# Patient Record
Sex: Female | Born: 1955 | Race: White | Hispanic: No | Smoking: Former smoker
Health system: Southern US, Community
[De-identification: ages and names within clinical notes are randomized; demographics above are authoritative.]

## PROBLEM LIST (undated history)

## (undated) DIAGNOSIS — K219 Gastro-esophageal reflux disease without esophagitis: Secondary | ICD-10-CM

## (undated) DIAGNOSIS — F32A Depression, unspecified: Secondary | ICD-10-CM

## (undated) DIAGNOSIS — C801 Malignant (primary) neoplasm, unspecified: Secondary | ICD-10-CM

## (undated) DIAGNOSIS — M549 Dorsalgia, unspecified: Secondary | ICD-10-CM

## (undated) DIAGNOSIS — M542 Cervicalgia: Secondary | ICD-10-CM

## (undated) DIAGNOSIS — M1990 Unspecified osteoarthritis, unspecified site: Secondary | ICD-10-CM

## (undated) DIAGNOSIS — Z5189 Encounter for other specified aftercare: Secondary | ICD-10-CM

## (undated) DIAGNOSIS — F419 Anxiety disorder, unspecified: Secondary | ICD-10-CM

## (undated) DIAGNOSIS — I10 Essential (primary) hypertension: Secondary | ICD-10-CM

## (undated) DIAGNOSIS — G8929 Other chronic pain: Secondary | ICD-10-CM

## (undated) DIAGNOSIS — J449 Chronic obstructive pulmonary disease, unspecified: Secondary | ICD-10-CM

## (undated) DIAGNOSIS — E079 Disorder of thyroid, unspecified: Secondary | ICD-10-CM

## (undated) DIAGNOSIS — F329 Major depressive disorder, single episode, unspecified: Secondary | ICD-10-CM

## (undated) DIAGNOSIS — D649 Anemia, unspecified: Secondary | ICD-10-CM

## (undated) HISTORY — PX: COLON SURGERY: SHX602

## (undated) HISTORY — PX: ABDOMINAL HYSTERECTOMY: SHX81

## (undated) HISTORY — PX: TONSILLECTOMY: SUR1361

## (undated) HISTORY — DX: Encounter for other specified aftercare: Z51.89

## (undated) HISTORY — PX: CHOLECYSTECTOMY: SHX55

## (undated) HISTORY — PX: HERNIA REPAIR: SHX51

## (undated) HISTORY — DX: Anemia, unspecified: D64.9

## (undated) HISTORY — DX: Gastro-esophageal reflux disease without esophagitis: K21.9

## (undated) HISTORY — DX: Chronic obstructive pulmonary disease, unspecified: J44.9

## (undated) HISTORY — DX: Unspecified osteoarthritis, unspecified site: M19.90

---

## 1998-11-24 HISTORY — PX: GASTRIC BYPASS: SHX52

## 2008-04-04 ENCOUNTER — Encounter: Payer: Self-pay | Admitting: Gastroenterology

## 2008-08-29 ENCOUNTER — Ambulatory Visit: Payer: Self-pay | Admitting: Gastroenterology

## 2008-08-29 DIAGNOSIS — K219 Gastro-esophageal reflux disease without esophagitis: Secondary | ICD-10-CM

## 2008-08-29 DIAGNOSIS — Z85038 Personal history of other malignant neoplasm of large intestine: Secondary | ICD-10-CM | POA: Insufficient documentation

## 2008-08-30 ENCOUNTER — Telehealth (INDEPENDENT_AMBULATORY_CARE_PROVIDER_SITE_OTHER): Payer: Self-pay | Admitting: *Deleted

## 2008-08-30 LAB — CONVERTED CEMR LAB: CEA: 2.6 ng/mL (ref 0.0–5.0)

## 2008-09-26 ENCOUNTER — Ambulatory Visit: Payer: Self-pay | Admitting: Gastroenterology

## 2008-09-26 ENCOUNTER — Encounter: Payer: Self-pay | Admitting: Gastroenterology

## 2008-09-27 ENCOUNTER — Encounter: Payer: Self-pay | Admitting: Gastroenterology

## 2009-01-03 ENCOUNTER — Ambulatory Visit (HOSPITAL_COMMUNITY): Admission: RE | Admit: 2009-01-03 | Discharge: 2009-01-03 | Payer: Self-pay | Admitting: General Surgery

## 2009-07-09 ENCOUNTER — Encounter: Admission: RE | Admit: 2009-07-09 | Discharge: 2009-07-09 | Payer: Self-pay | Admitting: Orthopedic Surgery

## 2009-12-05 ENCOUNTER — Ambulatory Visit: Payer: Self-pay | Admitting: Diagnostic Radiology

## 2009-12-05 ENCOUNTER — Emergency Department (HOSPITAL_BASED_OUTPATIENT_CLINIC_OR_DEPARTMENT_OTHER): Admission: EM | Admit: 2009-12-05 | Discharge: 2009-12-05 | Payer: Self-pay | Admitting: Emergency Medicine

## 2010-11-06 ENCOUNTER — Other Ambulatory Visit
Admission: RE | Admit: 2010-11-06 | Discharge: 2010-11-06 | Payer: Self-pay | Source: Home / Self Care | Admitting: Family Medicine

## 2010-12-15 ENCOUNTER — Encounter: Payer: Self-pay | Admitting: Family Medicine

## 2010-12-15 ENCOUNTER — Encounter: Payer: Self-pay | Admitting: General Surgery

## 2011-03-11 LAB — COMPREHENSIVE METABOLIC PANEL
Albumin: 4.1 g/dL (ref 3.5–5.2)
Alkaline Phosphatase: 102 U/L (ref 39–117)
BUN: 11 mg/dL (ref 6–23)
CO2: 23 mEq/L (ref 19–32)
Chloride: 100 mEq/L (ref 96–112)
GFR calc Af Amer: >60 mL/min (ref >60)
Glucose, Bld: 110 mg/dL — ABNORMAL HIGH (ref 70–99)
Potassium: 3.9 mEq/L (ref 3.5–5.1)
Total Protein: 7.5 g/dL (ref 6.0–8.3)

## 2012-10-26 ENCOUNTER — Ambulatory Visit (HOSPITAL_COMMUNITY)
Admission: RE | Admit: 2012-10-26 | Discharge: 2012-10-26 | Disposition: A | Discharge status: Home / Self Care | Payer: Self-pay | Source: Ambulatory Visit | Attending: Internal Medicine | Admitting: Internal Medicine

## 2012-10-26 ENCOUNTER — Other Ambulatory Visit (HOSPITAL_COMMUNITY): Payer: Self-pay | Admitting: Internal Medicine

## 2012-10-26 DIAGNOSIS — R062 Wheezing: Secondary | ICD-10-CM

## 2012-10-26 DIAGNOSIS — R0602 Shortness of breath: Secondary | ICD-10-CM

## 2013-01-17 ENCOUNTER — Emergency Department (HOSPITAL_COMMUNITY): Payer: BC Managed Care – PPO

## 2013-01-17 ENCOUNTER — Encounter (HOSPITAL_COMMUNITY): Payer: Self-pay

## 2013-01-17 ENCOUNTER — Inpatient Hospital Stay (HOSPITAL_COMMUNITY)
Admission: EM | Admit: 2013-01-17 | Discharge: 2013-01-18 | DRG: 088 | Disposition: A | Discharge status: Home / Self Care | Payer: BC Managed Care – PPO | Attending: Internal Medicine | Admitting: Internal Medicine

## 2013-01-17 DIAGNOSIS — F3289 Other specified depressive episodes: Secondary | ICD-10-CM | POA: Diagnosis present

## 2013-01-17 DIAGNOSIS — R0902 Hypoxemia: Secondary | ICD-10-CM

## 2013-01-17 DIAGNOSIS — Z9049 Acquired absence of other specified parts of digestive tract: Secondary | ICD-10-CM

## 2013-01-17 DIAGNOSIS — J9801 Acute bronchospasm: Secondary | ICD-10-CM

## 2013-01-17 DIAGNOSIS — I10 Essential (primary) hypertension: Secondary | ICD-10-CM | POA: Diagnosis present

## 2013-01-17 DIAGNOSIS — R079 Chest pain, unspecified: Secondary | ICD-10-CM

## 2013-01-17 DIAGNOSIS — D649 Anemia, unspecified: Secondary | ICD-10-CM | POA: Diagnosis present

## 2013-01-17 DIAGNOSIS — Z79899 Other long term (current) drug therapy: Secondary | ICD-10-CM

## 2013-01-17 DIAGNOSIS — Z9089 Acquired absence of other organs: Secondary | ICD-10-CM

## 2013-01-17 DIAGNOSIS — I77810 Thoracic aortic ectasia: Secondary | ICD-10-CM | POA: Diagnosis present

## 2013-01-17 DIAGNOSIS — J441 Chronic obstructive pulmonary disease with (acute) exacerbation: Secondary | ICD-10-CM

## 2013-01-17 DIAGNOSIS — M542 Cervicalgia: Secondary | ICD-10-CM | POA: Diagnosis present

## 2013-01-17 DIAGNOSIS — Z90710 Acquired absence of both cervix and uterus: Secondary | ICD-10-CM

## 2013-01-17 DIAGNOSIS — F17200 Nicotine dependence, unspecified, uncomplicated: Secondary | ICD-10-CM | POA: Diagnosis present

## 2013-01-17 DIAGNOSIS — Z791 Long term (current) use of non-steroidal anti-inflammatories (NSAID): Secondary | ICD-10-CM

## 2013-01-17 DIAGNOSIS — Z85038 Personal history of other malignant neoplasm of large intestine: Secondary | ICD-10-CM

## 2013-01-17 DIAGNOSIS — E119 Type 2 diabetes mellitus without complications: Secondary | ICD-10-CM | POA: Diagnosis present

## 2013-01-17 DIAGNOSIS — E039 Hypothyroidism, unspecified: Secondary | ICD-10-CM | POA: Diagnosis present

## 2013-01-17 DIAGNOSIS — R911 Solitary pulmonary nodule: Secondary | ICD-10-CM | POA: Diagnosis present

## 2013-01-17 DIAGNOSIS — I2510 Atherosclerotic heart disease of native coronary artery without angina pectoris: Secondary | ICD-10-CM | POA: Diagnosis present

## 2013-01-17 DIAGNOSIS — K219 Gastro-esophageal reflux disease without esophagitis: Secondary | ICD-10-CM | POA: Diagnosis present

## 2013-01-17 DIAGNOSIS — M940 Chondrocostal junction syndrome [Tietze]: Secondary | ICD-10-CM | POA: Diagnosis present

## 2013-01-17 DIAGNOSIS — G8929 Other chronic pain: Secondary | ICD-10-CM | POA: Diagnosis present

## 2013-01-17 DIAGNOSIS — F329 Major depressive disorder, single episode, unspecified: Secondary | ICD-10-CM | POA: Diagnosis present

## 2013-01-17 HISTORY — DX: Malignant (primary) neoplasm, unspecified: C80.1

## 2013-01-17 HISTORY — DX: Dorsalgia, unspecified: M54.9

## 2013-01-17 HISTORY — DX: Essential (primary) hypertension: I10

## 2013-01-17 HISTORY — DX: Depression, unspecified: F32.A

## 2013-01-17 HISTORY — DX: Other chronic pain: G89.29

## 2013-01-17 HISTORY — DX: Major depressive disorder, single episode, unspecified: F32.9

## 2013-01-17 HISTORY — DX: Disorder of thyroid, unspecified: E07.9

## 2013-01-17 HISTORY — DX: Cervicalgia: M54.2

## 2013-01-17 LAB — BLOOD GAS, ARTERIAL
Acid-Base Excess: 0.4 mmol/L (ref 0.0–2.0)
Bicarbonate: 25.1 mEq/L — ABNORMAL HIGH (ref 20.0–24.0)
Drawn by: 235321
FIO2: 0.5 %
O2 Saturation: 94.8 %
Patient temperature: 98.6
TCO2: 23.3 mmol/L (ref 0–100)
pCO2 arterial: 43.2 mmHg (ref 35.0–45.0)
pH, Arterial: 7.382 (ref 7.350–7.450)
pO2, Arterial: 79.1 mmHg — ABNORMAL LOW (ref 80.0–100.0)

## 2013-01-17 LAB — CBC
HCT: 37.9 % (ref 36.0–46.0)
Hemoglobin: 10.9 g/dL — ABNORMAL LOW (ref 12.0–15.0)
MCH: 20.6 pg — ABNORMAL LOW (ref 26.0–34.0)
MCHC: 28.8 g/dL — ABNORMAL LOW (ref 30.0–36.0)
MCV: 71.6 fL — ABNORMAL LOW (ref 78.0–100.0)
Platelets: 305 10*3/uL (ref 150–400)
RBC: 5.29 MIL/uL — ABNORMAL HIGH (ref 3.87–5.11)
RDW: 18.1 % — ABNORMAL HIGH (ref 11.5–15.5)
WBC: 9.9 10*3/uL (ref 4.0–10.5)

## 2013-01-17 LAB — BASIC METABOLIC PANEL
BUN: 12 mg/dL (ref 6–23)
CO2: 27 mEq/L (ref 19–32)
Calcium: 9 mg/dL (ref 8.4–10.5)
Chloride: 96 mEq/L (ref 96–112)
Creatinine, Ser: 0.65 mg/dL (ref 0.50–1.10)
GFR calc Af Amer: >90 mL/min (ref >90)
GFR calc non Af Amer: >90 mL/min (ref >90)
Glucose, Bld: 182 mg/dL — ABNORMAL HIGH (ref 70–99)
Potassium: 3.8 mEq/L (ref 3.5–5.1)
Sodium: 135 mEq/L (ref 135–145)

## 2013-01-17 LAB — TROPONIN I: Troponin I: <0.3 ng/mL (ref <0.30)

## 2013-01-17 MED ORDER — ACETAMINOPHEN 650 MG RE SUPP: 650.0000 mg | Freq: Four times a day (QID) | RECTAL | Status: DC | PRN

## 2013-01-17 MED ORDER — TRIAMTERENE-HCTZ 37.5-25 MG PO TABS
1.0000 | ORAL_TABLET | Freq: Every day | ORAL | Status: DC
  Administered 2013-01-18: 1 via ORAL
  Filled 2013-01-17: qty 1

## 2013-01-17 MED ORDER — ACETAMINOPHEN 325 MG PO TABS: 650.0000 mg | ORAL_TABLET | Freq: Four times a day (QID) | ORAL | Status: DC | PRN

## 2013-01-17 MED ORDER — IPRATROPIUM BROMIDE 0.02 % IN SOLN
0.5000 mg | RESPIRATORY_TRACT | Status: DC
  Administered 2013-01-17: 0.5 mg via RESPIRATORY_TRACT
  Filled 2013-01-17: qty 2.5

## 2013-01-17 MED ORDER — OXYCODONE-ACETAMINOPHEN 10-325 MG PO TABS: 1.0000 | ORAL_TABLET | Freq: Three times a day (TID) | ORAL | Status: DC | PRN

## 2013-01-17 MED ORDER — LEVOTHYROXINE SODIUM 125 MCG PO TABS
125.0000 ug | ORAL_TABLET | Freq: Every day | ORAL | Status: DC
  Administered 2013-01-18: 125 ug via ORAL
  Filled 2013-01-17 (×2): qty 1

## 2013-01-17 MED ORDER — ALBUTEROL SULFATE (5 MG/ML) 0.5% IN NEBU
5.0000 mg | INHALATION_SOLUTION | Freq: Once | RESPIRATORY_TRACT | Status: AC
  Administered 2013-01-17: 5 mg via RESPIRATORY_TRACT

## 2013-01-17 MED ORDER — IOHEXOL 350 MG/ML SOLN
100.0000 mL | Freq: Once | INTRAVENOUS | Status: AC | PRN
  Administered 2013-01-17: 100 mL via INTRAVENOUS

## 2013-01-17 MED ORDER — PREDNISONE 20 MG PO TABS
60.0000 mg | ORAL_TABLET | Freq: Once | ORAL | Status: AC
  Administered 2013-01-17: 60 mg via ORAL
  Filled 2013-01-17: qty 3

## 2013-01-17 MED ORDER — LEVOFLOXACIN IN D5W 750 MG/150ML IV SOLN
750.0000 mg | INTRAVENOUS | Status: DC
  Administered 2013-01-17: 750 mg via INTRAVENOUS
  Filled 2013-01-17 (×2): qty 150

## 2013-01-17 MED ORDER — HEPARIN SODIUM (PORCINE) 5000 UNIT/ML IJ SOLN
5000.0000 [IU] | Freq: Three times a day (TID) | INTRAMUSCULAR | Status: DC
  Administered 2013-01-18: 5000 [IU] via SUBCUTANEOUS
  Filled 2013-01-17 (×4): qty 1

## 2013-01-17 MED ORDER — ALBUTEROL SULFATE (5 MG/ML) 0.5% IN NEBU: 2.5000 mg | INHALATION_SOLUTION | RESPIRATORY_TRACT | Status: DC | PRN

## 2013-01-17 MED ORDER — OXYCODONE-ACETAMINOPHEN 5-325 MG PO TABS
1.0000 | ORAL_TABLET | Freq: Once | ORAL | Status: AC
  Administered 2013-01-17: 1 via ORAL
  Filled 2013-01-17: qty 1

## 2013-01-17 MED ORDER — ALBUTEROL SULFATE (5 MG/ML) 0.5% IN NEBU
2.5000 mg | INHALATION_SOLUTION | RESPIRATORY_TRACT | Status: DC
  Administered 2013-01-17: 2.5 mg via RESPIRATORY_TRACT
  Filled 2013-01-17: qty 0.5

## 2013-01-17 MED ORDER — ARIPIPRAZOLE 10 MG PO TABS
10.0000 mg | ORAL_TABLET | Freq: Every day | ORAL | Status: DC
  Administered 2013-01-18: 10 mg via ORAL
  Filled 2013-01-17: qty 1

## 2013-01-17 MED ORDER — ASPIRIN EC 325 MG PO TBEC
325.0000 mg | DELAYED_RELEASE_TABLET | Freq: Every day | ORAL | Status: DC
  Administered 2013-01-18: 325 mg via ORAL
  Filled 2013-01-17: qty 1

## 2013-01-17 MED ORDER — ALBUTEROL (5 MG/ML) CONTINUOUS INHALATION SOLN
15.0000 mg | INHALATION_SOLUTION | Freq: Once | RESPIRATORY_TRACT | Status: AC
  Administered 2013-01-17: 15 mg via RESPIRATORY_TRACT

## 2013-01-17 MED ORDER — METHYLPREDNISOLONE SODIUM SUCC 125 MG IJ SOLR
125.0000 mg | Freq: Three times a day (TID) | INTRAMUSCULAR | Status: DC
  Administered 2013-01-17 – 2013-01-18 (×2): 125 mg via INTRAVENOUS
  Filled 2013-01-17 (×5): qty 2

## 2013-01-17 MED ORDER — PANTOPRAZOLE SODIUM 40 MG PO TBEC
40.0000 mg | DELAYED_RELEASE_TABLET | Freq: Every day | ORAL | Status: DC
  Administered 2013-01-18: 40 mg via ORAL
  Filled 2013-01-17: qty 1

## 2013-01-17 MED ORDER — LORAZEPAM 2 MG/ML IJ SOLN
0.5000 mg | Freq: Once | INTRAMUSCULAR | Status: AC
  Administered 2013-01-17: via INTRAVENOUS
  Filled 2013-01-17: qty 1

## 2013-01-17 MED ORDER — ASPIRIN EC 81 MG PO TBEC: 81.0000 mg | DELAYED_RELEASE_TABLET | Freq: Every day | ORAL | Status: DC

## 2013-01-17 MED ORDER — AMITRIPTYLINE HCL 25 MG PO TABS
25.0000 mg | ORAL_TABLET | Freq: Every day | ORAL | Status: DC
  Administered 2013-01-17: 25 mg via ORAL
  Filled 2013-01-17 (×2): qty 1

## 2013-01-17 MED ORDER — OXYCODONE-ACETAMINOPHEN 5-325 MG PO TABS
1.0000 | ORAL_TABLET | Freq: Three times a day (TID) | ORAL | Status: DC | PRN
  Administered 2013-01-18 (×2): 1 via ORAL
  Filled 2013-01-17 (×2): qty 1

## 2013-01-17 MED ORDER — IPRATROPIUM BROMIDE 0.02 % IN SOLN
0.5000 mg | Freq: Once | RESPIRATORY_TRACT | Status: AC
Start: 2013-01-17 — End: 2013-01-17
  Administered 2013-01-17: 0.5 mg via RESPIRATORY_TRACT
  Filled 2013-01-17: qty 2.5

## 2013-01-17 MED ORDER — OXYCODONE HCL 5 MG PO TABS: 5.0000 mg | ORAL_TABLET | Freq: Three times a day (TID) | ORAL | Status: DC | PRN

## 2013-01-17 MED ORDER — IPRATROPIUM BROMIDE 0.02 % IN SOLN: 0.5000 mg | RESPIRATORY_TRACT | Status: DC | PRN

## 2013-01-17 MED ORDER — SERTRALINE HCL 100 MG PO TABS
100.0000 mg | ORAL_TABLET | Freq: Every day | ORAL | Status: DC
  Administered 2013-01-18: 100 mg via ORAL
  Filled 2013-01-17: qty 1

## 2013-01-17 MED ORDER — SODIUM CHLORIDE 0.9 % IJ SOLN
3.0000 mL | Freq: Two times a day (BID) | INTRAMUSCULAR | Status: DC
  Administered 2013-01-18 (×2): 3 mL via INTRAVENOUS

## 2013-01-17 NOTE — ED Provider Notes (Signed)
History    57 year old female with dyspnea. Gradual onset several days ago but acutely worse in the last 24 hours. Today patient also began having some central chest pressure which started this morning. This has remained relatively constant since. Does not radiate. No appreciable exacerbating relieving factors. Patient states that her shortness of breath is worse with exertion though. Slightly improved at rest, but not completely relieved. Occasional nonproductive cough. No fevers or chills. No unusual leg pain or swelling. No diagnosed CAD. Patient is a smoker. No history of DVT/PE. No recent surgical procedures or prolonged immobilization.   CSN: [id number]  Arrival date & time 01/17/13  1607   First MD Initiated Contact with Patient 01/17/13 1630      Chief Complaint  Patient presents with  . Chest Pain    (Consider location/radiation/quality/duration/timing/severity/associated sxs/prior treatment) HPI  Past Medical History  Diagnosis Date  . Hypertension   . Thyroid disease     hyperthyroidism  . Chronic back pain   . Chronic neck pain     History reviewed. No pertinent past surgical history.  History reviewed. No pertinent family history.  History  Substance Use Topics  . Smoking status: Current Every Day Smoker -- 1.00 packs/day    Types: Cigarettes  . Smokeless tobacco: Not on file  . Alcohol Use: No    OB History   Grav Para Term Preterm Abortions TAB SAB Ect Mult Living                  Review of Systems  All systems reviewed and negative, other than as noted in HPI.   Allergies  Review of patient's allergies indicates no known allergies.  Home Medications   Current Outpatient Rx  Name  Route  Sig  Dispense  Refill  . amitriptyline (ELAVIL) 25 MG tablet   Oral   Take 25 mg by mouth at bedtime.         . ARIPiprazole (ABILIFY) 10 MG tablet   Oral   Take 10 mg by mouth daily.         Marland Kitchen ibuprofen (ADVIL,MOTRIN) 200 MG tablet   Oral   Take  800 mg by mouth every 8 (eight) hours as needed for pain.         Marland Kitchen levothyroxine (SYNTHROID, LEVOTHROID) 125 MCG tablet   Oral   Take 125 mcg by mouth daily.         Marland Kitchen omeprazole (PRILOSEC) 20 MG capsule   Oral   Take 20 mg by mouth daily.         Marland Kitchen oxyCODONE-acetaminophen (PERCOCET) 10-325 MG per tablet   Oral   Take 1 tablet by mouth every 8 (eight) hours as needed for pain.         Marland Kitchen sertraline (ZOLOFT) 100 MG tablet   Oral   Take 100 mg by mouth daily.         Marland Kitchen triamterene-hydrochlorothiazide (DYAZIDE) 37.5-25 MG per capsule   Oral   Take 1 capsule by mouth every morning.           BP 155/75  Pulse 98  Resp 27  SpO2 99%  Physical Exam  Nursing note and vitals reviewed. Constitutional: She appears well-developed and well-nourished.  Sitting up in bed. Mildly uncomfortable appearing. Obese.   HENT:  Head: Normocephalic and atraumatic.  Eyes: Conjunctivae are normal. Pupils are equal, round, and reactive to light. Right eye exhibits no discharge. Left eye exhibits no discharge.  Neck: Neck supple.  Cardiovascular: Normal rate, regular rhythm and normal heart sounds.  Exam reveals no gallop and no friction rub.   No murmur heard. Pulmonary/Chest: She has wheezes. She exhibits no tenderness.  Mild tachypnea. Diffuse wheezing bilaterally.  Abdominal: Soft. She exhibits no distension. There is no tenderness.  Musculoskeletal: She exhibits no edema and no tenderness.  No calf tenderness. Negative homan's.   Neurological: She is alert.  Skin: Skin is warm and dry.  Psychiatric: She has a normal mood and affect. Her behavior is normal. Thought content normal.    ED Course  Procedures (including critical care time)  Labs Reviewed  CBC - Abnormal; Notable for the following:    RBC 5.29 (*)    Hemoglobin 10.9 (*)    MCV 71.6 (*)    MCH 20.6 (*)    MCHC 28.8 (*)    RDW 18.1 (*)    All other components within normal limits  BASIC METABOLIC PANEL -  Abnormal; Notable for the following:    Glucose, Bld 182 (*)    All other components within normal limits  BLOOD GAS, ARTERIAL - Abnormal; Notable for the following:    pO2, Arterial 79.1 (*)    Bicarbonate 25.1 (*)    All other components within normal limits  TROPONIN I   Dg Chest 2 View  01/17/2013  *RADIOLOGY REPORT*  Clinical Data: Chest pain.  Wheezing.  Tobacco use.  Hypertension.  CHEST - 2 VIEW  Comparison: 01/17/2013  Findings: Cardiothoracic index 54%, compatible with mild cardiomegaly.  There is tortuosity and potentially ectasia of the ascending thoracic aorta.  The lungs appear clear.  No pleural effusion observed.  IMPRESSION:  1.  Mild cardiomegaly. 2.  Tortuosity and potentially ectasia of the descending thoracic aorta.   Original Report Authenticated By: Gaylyn Rong, M.D.    Ct Angio Chest W/cm &/or Wo Cm  01/17/2013  *RADIOLOGY REPORT*  Clinical Data: 57 year old female with chest pain and shortness of breath.  CT ANGIOGRAPHY CHEST  Technique:  Multidetector CT imaging of the chest using the standard protocol during bolus administration of intravenous contrast. Multiplanar reconstructed images including MIPs were obtained and reviewed to evaluate the vascular anatomy.  Contrast: OMNIPAQUE IOHEXOL 350 MG/ML SOLN  Comparison: 01/17/2013 chest radiograph  Findings: This is a technically adequate study but motion artifact and suboptimal contrast opacification limits evaluation.  No definite pulmonary emboli are identified.  Moderate to heavy coronary artery calcifications are present. Ectasia of the ascending thoracic aorta noted measuring 3.8 cm in diameter.  There are no pleural or pericardial effusions present. No enlarged lymph nodes are present.  Minimal bibasilar atelectasis/scarring is noted.  Mild centrilobular emphysema is noted.  A 7 mm subpleural/peripheral nodule along the posterior right upper lobe (image 20) is present.  There is no evidence of pulmonary mass,  airspace disease, consolidation or endobronchial/endotracheal lesion.  No acute or suspicious bony abnormalities are noted. Postoperative changes along the proximal stomach noted. There is a small hiatal hernia present.  IMPRESSION: No evidence of pulmonary emboli, but study is suboptimal as described above.  Coronary artery disease and ectatic ascending thoracic aorta.  7 mm posterior right upper lobe pulmonary nodule. If the patient is at high risk for bronchogenic carcinoma, follow-up chest CT at 3-6 months is recommended.  If the patient is at low risk for bronchogenic carcinoma, follow-up chest CT at 6-12 months is recommended.  This recommendation follows the consensus statement: Guidelines for Management of Small Pulmonary Nodules Detected on CT Scans:  A Statement from the Fleischner Society as published in Radiology 2005; 237:395-400.  Centrilobular emphysema.  Small hiatal hernia.   Original Report Authenticated By: Harmon Pier, M.D.    EKG:  Rhythm: normal sinus Poor baseline Vent. rate 98 BPM PR interval 140 ms QRS duration 100 ms QT/QTc 348/444 ms Anterior q waves ST segments: NS ST changes Comparison: none   1. Hypoxia   2. Bronchospasm   3. Chest pain       MDM  57 year old female with dyspnea and chest pain. Consider ACS, but doubt Patient with diffuse wheezing on exam. Her chest x-ray is clear. CT angiography was performed to evaluate for possible pulmonary embolism. There is no evidence of pulmonary embolism although suboptimal study because of some motion artifact. Lung parenchyma is unremarkable in appearance aside from a nodule. Patient with a long-standing history of smoking. Suspect she has undiagnosed underlying lung disease. Patient received steroids. She received multiple nebs. She remains symptomatic and hypoxic with an oxygen requirement. Will admit for further treatment and evaluation.        Raeford Razor, MD 01/20/13 [phone]

## 2013-01-17 NOTE — ED Notes (Signed)
Patient transported to CT 

## 2013-01-17 NOTE — H&P (Signed)
Hospitalist Admission History and Physical  Patient name: Jacqueline Moyer Medical record number: [id number] Date of birth: 04-07-1956 Age: 58 y.o. Gender: female  Primary Care Provider: Berenda Morale, MD  Chief Complaint: dyspnea, COPD exacerbation History of Present Illness:This is a 57 y.o. year old female with past medical history of hypertension, reflux, colon cancer status post resection in 2004, 40-pack-year smoking history presenting with shortness of breath x10 days. Patient reports having progressive shortness of breath that has been unrelieved at home. Predominant symptoms have been wheezing and chest tightness. No fevers or chills. No body aches. Patient has had some mild URI symptoms including rhinorrhea and nasal congestion. Has been still smoking despite symptoms. Patient states that she's been without insurance however received insurance over the past week. [facility] to [facility] care within the past one to 2 days. Shortness but has persisted to the point of having some dyspnea even while at rest. Shortness rest is worse with exertion. Patient has had some mild chest pain. Though patient describes pain as more of a chest tightness. No prior history of MI. No significant family history of early MI. Patient denies any recent extended trips. No prior history of blood clots. Patient has not been formally evaluated for COPD in the past.  In the ED, patient was noted to be hypoxic with O2 sats around 89% on room air. Patient was given DuoNeb treatment with patient reporting marked improvement in resp status afterwards however still with persistent mild dyspnea. Patient also given 60 mg of oral prednisone A CT angiogram was obtained that was negative for PE but it showed central lobular emphysema, a 7 mm right upper lobe pulmonary nodule, coronary artery disease and ectatic thoracic aorta.  Patient Active Problem List  Diagnosis  . GERD  . PERSONAL HX COLON CANCER    Past Medical History: Past Medical History  Diagnosis Date  . Hypertension   . Thyroid disease     hyperthyroidism  . Chronic back pain   . Chronic neck pain   . Depression   . Cancer approx 2004    colon cancer    Past Surgical History: Past Surgical History  Procedure Laterality Date  . Cholecystectomy    . Tonsillectomy    . Abdominal hysterectomy    . Colon surgery  approx. 2004    for colon cancer    Social History: History   Social History  . Marital Status: Divorced    Spouse Name: N/A    Number of Children: N/A  . Years of Education: N/A   Social History Main Topics  . Smoking status: Current Every Day Smoker -- 1.00 packs/day for 40 years    Types: Cigarettes  . Smokeless tobacco: Never Used  . Alcohol Use: No  . Drug Use: No  . Sexually Active: None   Other Topics Concern  . None   Social History Narrative  . None    Family History: History reviewed. No pertinent family history.  Allergies: No Known Allergies  Current Facility-Administered Medications  Medication Dose Route Frequency Provider Last Rate Last Dose  . acetaminophen (TYLENOL) tablet 650 mg  650 mg Oral Q6H PRN Doree Albee, MD       Or  . acetaminophen (TYLENOL) suppository 650 mg  650 mg Rectal Q6H PRN Doree Albee, MD      . albuterol (PROVENTIL) (5 MG/ML) 0.5% nebulizer solution 2.5 mg  2.5 mg Nebulization Q4H Doree Albee, MD      . albuterol (  PROVENTIL) (5 MG/ML) 0.5% nebulizer solution 2.5 mg  2.5 mg Nebulization Q2H PRN Doree Albee, MD      . aspirin EC tablet 81 mg  81 mg Oral Daily Doree Albee, MD      . heparin injection 5,000 Units  5,000 Units Subcutaneous Q8H Doree Albee, MD      . ipratropium (ATROVENT) nebulizer solution 0.5 mg  0.5 mg Nebulization Q4H Doree Albee, MD      . ipratropium (ATROVENT) nebulizer solution 0.5 mg  0.5 mg Nebulization Q2H PRN Doree Albee, MD      . levofloxacin (LEVAQUIN) IVPB 750 mg  750 mg Intravenous Q24H Doree Albee,  MD      . methylPREDNISolone sodium succinate (SOLU-MEDROL) 125 mg/2 mL injection 125 mg  125 mg Intravenous Q8H Doree Albee, MD      . sodium chloride 0.9 % injection 3 mL  3 mL Intravenous Q12H Doree Albee, MD       Current Outpatient Prescriptions  Medication Sig Dispense Refill  . amitriptyline (ELAVIL) 25 MG tablet Take 25 mg by mouth at bedtime.      . ARIPiprazole (ABILIFY) 10 MG tablet Take 10 mg by mouth daily.      Marland Kitchen ibuprofen (ADVIL,MOTRIN) 200 MG tablet Take 800 mg by mouth every 8 (eight) hours as needed for pain.      Marland Kitchen levothyroxine (SYNTHROID, LEVOTHROID) 125 MCG tablet Take 125 mcg by mouth daily.      Marland Kitchen omeprazole (PRILOSEC) 20 MG capsule Take 20 mg by mouth daily.      Marland Kitchen oxyCODONE-acetaminophen (PERCOCET) 10-325 MG per tablet Take 1 tablet by mouth every 8 (eight) hours as needed for pain.      Marland Kitchen sertraline (ZOLOFT) 100 MG tablet Take 100 mg by mouth daily.      Marland Kitchen triamterene-hydrochlorothiazide (DYAZIDE) 37.5-25 MG per capsule Take 1 capsule by mouth every morning.       Review Of Systems: 12 point ROS negative except as noted above in HPI.  Physical Exam: Filed Vitals:   01/17/13 1945  BP:   Pulse: 113  Resp: 27    General: obese, mild resp distree HEENT: PERRLA, extra ocular movement intact and sclera clear, anicteric Heart: tachycardic in setting of albuterol use, no murmurs  Lungs: diffuse expiratory wheezes, + mild anterior chest wall TTP  Abdomen: obese, non tender  Extremities: 1-2 + pitting edema  Skin:no rashes Neurology: normal without focal findings, mental status, speech normal, alert and oriented x3 and generalized dizziness in setting of aluterol use   Labs and Imaging: Lab Results  Component Value Date/Time   NA 135 01/17/2013  4:45 PM   K 3.8 01/17/2013  4:45 PM   CL 96 01/17/2013  4:45 PM   CO2 27 01/17/2013  4:45 PM   BUN 12 01/17/2013  4:45 PM   CREATININE 0.65 01/17/2013  4:45 PM   GLUCOSE 182* 01/17/2013  4:45 PM   Lab Results   Component Value Date   WBC 9.9 01/17/2013   HGB 10.9* 01/17/2013   HCT 37.9 01/17/2013   MCV 71.6* 01/17/2013   PLT 305 01/17/2013   Dg Chest 2 View  01/17/2013  *RADIOLOGY REPORT*  Clinical Data: Chest pain.  Wheezing.  Tobacco use.  Hypertension.  CHEST - 2 VIEW  Comparison: 01/17/2013  Findings: Cardiothoracic index 54%, compatible with mild cardiomegaly.  There is tortuosity and potentially ectasia of the ascending thoracic aorta.  The lungs appear clear.  No pleural effusion observed.  IMPRESSION:  1.  Mild cardiomegaly. 2.  Tortuosity and potentially ectasia of the descending thoracic aorta.   Original Report Authenticated By: Gaylyn Rong, M.D.    Ct Angio Chest W/cm &/or Wo Cm  01/17/2013  *RADIOLOGY REPORT*  Clinical Data: 57 year old female with chest pain and shortness of breath.  CT ANGIOGRAPHY CHEST  Technique:  Multidetector CT imaging of the chest using the standard protocol during bolus administration of intravenous contrast. Multiplanar reconstructed images including MIPs were obtained and reviewed to evaluate the vascular anatomy.  Contrast: OMNIPAQUE IOHEXOL 350 MG/ML SOLN  Comparison: 01/17/2013 chest radiograph  Findings: This is a technically adequate study but motion artifact and suboptimal contrast opacification limits evaluation.  No definite pulmonary emboli are identified.  Moderate to heavy coronary artery calcifications are present. Ectasia of the ascending thoracic aorta noted measuring 3.8 cm in diameter.  There are no pleural or pericardial effusions present. No enlarged lymph nodes are present.  Minimal bibasilar atelectasis/scarring is noted.  Mild centrilobular emphysema is noted.  A 7 mm subpleural/peripheral nodule along the posterior right upper lobe (image 20) is present.  There is no evidence of pulmonary mass, airspace disease, consolidation or endobronchial/endotracheal lesion.  No acute or suspicious bony abnormalities are noted. Postoperative changes  along the proximal stomach noted. There is a small hiatal hernia present.  IMPRESSION: No evidence of pulmonary emboli, but study is suboptimal as described above.  Coronary artery disease and ectatic ascending thoracic aorta.  7 mm posterior right upper lobe pulmonary nodule. If the patient is at high risk for bronchogenic carcinoma, follow-up chest CT at 3-6 months is recommended.  If the patient is at low risk for bronchogenic carcinoma, follow-up chest CT at 6-12 months is recommended.  This recommendation follows the consensus statement: Guidelines for Management of Small Pulmonary Nodules Detected on CT Scans: A Statement from the Fleischner Society as published in Radiology 2005; 237:395-400.  Centrilobular emphysema.  Small hiatal hernia.   Original Report Authenticated By: Harmon Pier, M.D.    EKG: sinus rhythm, nonspecific t wave changes.     Assessment and Plan: Jacqueline Moyer is a 57 y.o. year old female presenting with dyspnea  Dyspnea/Resp: Clinically with COPD exacerbation. Likely viral/URI-induced. CTA negative for PE/pneumonia. IV Levaquin. IV Solu-Medrol x24 hours. Albuterol Atrovent nebs. Tobacco cessation consult. Noted right upper lobe pulmonary nodule. Positive malignancy risk factors including active smoking and history of colon cancer status post colon resection. Will need outpatient followup for this.  Chest pain/CV: Likely secondary to chest tightness related to #1 above. Improved after neb treatment Also with mild costochondritis. Patient with some cardiovascular risk factors including hypertension, hyperlipidemia, obesity, smoking. We'll cycle cardiac enzymes. Full dose aspirin. Risk stratification labs. Continue home Maxzide. Tylenol for costochondritis.  Anemia:  Hemoglobin of 10.7 with MCV in the low 70s on presentation. Suspect iron deficiency anemia. We'll check anemia panel.  Chronic pain:  Continue home amitriptyline and Percocet.  Psych: Mood stable.   Continue home Zoloft and Abilify.  Hypothyroid: Continue Synthroid. Check TSH as part of risk stratification workup.   FEN/GI: Heart healthy diet.  Prophylaxis: subq heparin. PPI Disposition: Pending further evaluation.  Code Status: full code.        Doree Albee MD  Pager: [phone]

## 2013-01-17 NOTE — Progress Notes (Signed)
Pt confirms she is seen by providers at [facility] [employer] --[facility], [facility], [facility], [facility], [facility], [facility], [facility], [facility] EPIC updated

## 2013-01-17 NOTE — ED Notes (Signed)
Pt c/o SOB and chest pain.  Sts chest pain/pressure when trying to breathe.  Sts chest pain has resolved with 2L [place].  Pt was sent here by her PCP.  Sts she was told that her EKG was abnormal.  Vitals are stable.

## 2013-01-17 NOTE — ED Notes (Signed)
Patient taken to restroom via wheelchair with portable oxygen. Upon arrival back to room, Patient verbalized to me she was very short of breath. 02 sats at 92%.

## 2013-01-17 NOTE — ED Notes (Signed)
MD at bedside. 

## 2013-01-17 NOTE — ED Notes (Signed)
Pt's O2 sat dropped to 86% while sitting on 5L [place].  MD notified.  Pt put on a Venturi mask at 50%.

## 2013-01-18 DIAGNOSIS — J441 Chronic obstructive pulmonary disease with (acute) exacerbation: Principal | ICD-10-CM

## 2013-01-18 DIAGNOSIS — R079 Chest pain, unspecified: Secondary | ICD-10-CM

## 2013-01-18 DIAGNOSIS — K219 Gastro-esophageal reflux disease without esophagitis: Secondary | ICD-10-CM

## 2013-01-18 DIAGNOSIS — J9801 Acute bronchospasm: Secondary | ICD-10-CM

## 2013-01-18 LAB — RETICULOCYTES: RBC.: 4.88 MIL/uL (ref 3.87–5.11)

## 2013-01-18 LAB — IRON AND TIBC
Iron: 19 ug/dL — ABNORMAL LOW (ref 42–135)
Saturation Ratios: 4 % — ABNORMAL LOW (ref 20–55)
TIBC: 486 ug/dL — ABNORMAL HIGH (ref 250–470)

## 2013-01-18 LAB — VITAMIN B12: Vitamin B-12: 375 pg/mL (ref 211–911)

## 2013-01-18 LAB — HEMOGLOBIN A1C
Hgb A1c MFr Bld: 7.6 % — ABNORMAL HIGH (ref <5.7)
Mean Plasma Glucose: 171 mg/dL — ABNORMAL HIGH (ref <117)

## 2013-01-18 LAB — TSH: TSH: 6.489 u[IU]/mL — ABNORMAL HIGH (ref 0.350–4.500)

## 2013-01-18 LAB — FOLATE: Folate: 12.4 ng/mL

## 2013-01-18 LAB — TROPONIN I
Troponin I: <0.3 ng/mL (ref <0.30)
Troponin I: <0.3 ng/mL (ref <0.30)

## 2013-01-18 MED ORDER — OMEPRAZOLE 20 MG PO CPDR: 40.0000 mg | DELAYED_RELEASE_CAPSULE | Freq: Every day | ORAL | Status: DC

## 2013-01-18 MED ORDER — LEVALBUTEROL HCL 0.63 MG/3ML IN NEBU
0.6300 mg | INHALATION_SOLUTION | RESPIRATORY_TRACT | Status: DC | PRN
  Filled 2013-01-18: qty 3

## 2013-01-18 MED ORDER — LEVOFLOXACIN 750 MG PO TABS: 750.0000 mg | ORAL_TABLET | Freq: Every day | ORAL | Status: DC

## 2013-01-18 MED ORDER — LEVALBUTEROL HCL 0.63 MG/3ML IN NEBU
0.6300 mg | INHALATION_SOLUTION | Freq: Four times a day (QID) | RESPIRATORY_TRACT | Status: DC
  Administered 2013-01-18: 0.63 mg via RESPIRATORY_TRACT
  Filled 2013-01-18 (×5): qty 3

## 2013-01-18 MED ORDER — TIOTROPIUM BROMIDE MONOHYDRATE 18 MCG IN CAPS: 18.0000 ug | ORAL_CAPSULE | Freq: Every day | RESPIRATORY_TRACT | Status: DC

## 2013-01-18 MED ORDER — FLUTICASONE-SALMETEROL 250-50 MCG/DOSE IN AEPB: 1.0000 | INHALATION_SPRAY | Freq: Two times a day (BID) | RESPIRATORY_TRACT | Status: DC

## 2013-01-18 MED ORDER — ALBUTEROL SULFATE HFA 108 (90 BASE) MCG/ACT IN AERS: 2.0000 | INHALATION_SPRAY | RESPIRATORY_TRACT | Status: DC | PRN

## 2013-01-18 MED ORDER — PREDNISONE 20 MG PO TABS: 20.0000 mg | ORAL_TABLET | Freq: Every day | ORAL | Status: DC

## 2013-01-18 MED ORDER — IPRATROPIUM BROMIDE 0.02 % IN SOLN
0.5000 mg | Freq: Four times a day (QID) | RESPIRATORY_TRACT | Status: DC
  Administered 2013-01-18: 0.5 mg via RESPIRATORY_TRACT
  Filled 2013-01-18: qty 2.5

## 2013-01-18 NOTE — Progress Notes (Signed)
Patient noted to have extreme jitters and anxiety post nebulizer therapy. RT treatment protocol with assessment completed. Order changed accordingly.

## 2013-01-18 NOTE — Progress Notes (Signed)
Inpatient Diabetes Program Recommendations  AACE/ADA: New Consensus Statement on Inpatient Glycemic Control (2013)  Target Ranges:  Prepandial:   less than 140 mg/dL      Peak postprandial:   less than 180 mg/dL (1-2 hours)      Critically ill patients:  140 - 180 mg/dL   Reason for Visit: Newly diagnosed DM  Briefly met with pt regarding diagnosis of DM.  Pt was ready to be discharged and not interested in discussing glycemic control.  Pt states she will f/u with PCP.   MD and RN made aware of same.  Thanks. Ailene Ards, RD, LDN, CDE Inpatient Diabetes Coordinator [phone]

## 2013-01-18 NOTE — Progress Notes (Signed)
Inpatient Diabetes Program Recommendations  AACE/ADA: New Consensus Statement on Inpatient Glycemic Control (2013)  Target Ranges:  Prepandial:   less than 140 mg/dL      Peak postprandial:   less than 180 mg/dL (1-2 hours)      Critically ill patients:  140 - 180 mg/dL   Reason for Visit: Elevated Hgb A1C.  No history of diabetes noted.  Inpatient Diabetes Program Recommendations HgbA1C: Hgb A1C is 7.6 indicating a mean glucose of 171 mg/dl  Note: Lab glucose was 182 mg/dl yesterday.  Please complete Glycemic Control Order Set and discuss implications of elevated Hgb A1C with patient.  Please add diagnosis of diabetes to Hospital Problem List.  Will need nursing staff to instruct regarding basic diabetes self-care, a dietitian consult, and needs assessment regarding possible outpatient diabetes education.  Thank you.  Jahzeel Poythress S. Elsie Lincoln, RN, CNS, CDE Inpatient Diabetes Program, team pager [phone]

## 2013-01-18 NOTE — Progress Notes (Signed)
Patient received from ER to room 1440. Extremely tremulous/jittery to the point of unable to hold styrofoam cup and drink from it-from prior nebs in ER? [facility] night coverage notified to get something to hopefully settle patient down. Continue to monitor. Jacqueline Moyer

## 2013-01-18 NOTE — Discharge Summary (Signed)
Physician Discharge Summary  Jacqueline Moyer HYQ:[id number] DOB: 02/07/1956 DOA: 01/17/2013  PCP: Berenda Morale, MD  Admit date: 01/17/2013 Discharge date: 01/18/2013  Time spent: <30 minutes  Recommendations for Outpatient Follow-up:      Follow-up Information   Please follow up. (PCP  with [facility] in [place], call for appt upon discharge)      Discharge Diagnoses:  AcuteCOPD exacerbation Chest pain Anemia Chronic pain Hypothyroidism Depression/psych Right upper lobe nodule-7 mm Elevated TSH-followup with PCP, have repeat thyroid function tests in 4-6 weeks Elevated blood sugar/diabetes mellitus-newly diagnosed, follow up with PCP Discharge Condition: improved/stable  Diet recommendation: modified carb  Filed Weights   01/17/13 2227 01/18/13 0540  Weight: 112.8 kg (248 lb 10.9 oz) 112.2 kg (247 lb 5.7 oz)    History of present illness:  dyspnea, COPD exacerbation  History of Present Illness:This is a 57 y.o. year old female with past medical history of hypertension, reflux, colon cancer status post resection in 2004, 40-pack-year smoking history presenting with shortness of breath x10 days. Patient reports having progressive shortness of breath that has been unrelieved at home. Predominant symptoms have been wheezing and chest tightness. No fevers or chills. No body aches. Patient has had some mild URI symptoms including rhinorrhea and nasal congestion. Has been still smoking despite symptoms. Patient states that she's been without insurance however received insurance over the past week. [facility] within the past one to 2 days. Shortness but has persisted to the point of having some dyspnea even while at rest. Shortness rest is worse with exertion. Patient has had some mild chest pain. Though patient describes pain as more of a chest tightness. No prior history of MI. No significant family history of early MI. Patient  denies any recent extended trips. No prior history of blood clots.  Patient has not been formally evaluated for COPD in the past.  In the ED, patient was noted to be hypoxic with O2 sats around 89% on room air. Patient was given DuoNeb treatment with patient reporting marked improvement in resp status afterwards however still with persistent mild dyspnea. Patient also given 60 mg of oral prednisone  A CT angiogram was obtained that was negative for PE but it showed central lobular emphysema, a 7 mm right upper lobe pulmonary nodule, coronary artery disease and ectatic thoracic aorta.   Hospital Course:  Dyspnea/acute COPD exacerbation:  Clinically with COPD exacerbation. Likely viral/URI-induced. CTA negative for PE/pneumonia.- Noted right upper lobe pulmonary nodule. Positive malignancy risk factors including active smoking and history of colon cancer status post colon resection. Will need outpatient followup for this. -She was placed on antibiotics, IV steroids, nebulized bronchodilators and supplemental oxygen -pt responded well to these interventions and clinically much improved on followup today -She is oxygenating well on room air-92% with no further shortness of breath. She wants to be discharged for outpatient followup and I think this is reasonable -Will discharge on oral prednisone, antibiotics, bronchodilators and she is to followup with her PCP in one to 2 weeks -She was counseled to quit tobacco. Chest pain/CV:  Likely secondary to chest tightness related to #1 above. Improved after neb treatment Also with mild costochondritis. Patient with some cardiovascular risk factors including hypertension, hyperlipidemia, obesity, smoking.  -Cardiac enzymes were cycled and came back negative. -She has no further chest pain and followup today -She is to follow up outpatient with her PCP. Anemia:  -Her hemoglobin on admission was10.9 with MCV  in the low 70s on presentation., and anemia he was  done and is pending at the time of discharge. Patient is to followup with her PCP for results. Chronic pain:  Continue home amitriptyline and Percocet.  Depression/psych  She was maintained on Zoloft and Abilify during her hospital stay.  Hypothyroid:  -Her TSH was done and was mildly elevated at 6.49. She is to followup with her PCP and have recheck thyroid function tests in 4-6 weeks Lung nodule, right upper lobe -Discussed with patient is a smoker, she is to followup with her PCP in 6-12 months for followup CT scan and she voiced understanding and agrees to followup. She was counseled to quit tobacco. Elevated blood sugar/diabetes mellitus -The patient's blood glucose was elevated at 182 on admission and it was 171 on recheck, the impression was that the steroids were constributing factor; a hemoglobin A1C was done to further eval and came back elevated at 7.6. I discussed starting patient on oral hypoglycemics for new onset diabetes, but she prefers to followup with her PCP further monitoring of her blood sugars and initiation of medications as appropriate per her PCP outpatient. Procedures:  none  Consultations: .none Discharge Exam: Filed Vitals:   01/17/13 2145 01/17/13 2227 01/17/13 2235 01/18/13 0540  BP:  160/89  140/72  Pulse: 114 112  87  Temp:  98 F (36.7 C)  98.1 F (36.7 C)  TempSrc:  Oral  Oral  Resp: 26 24  20   Height:  5\' 5"  (1.651 m)    Weight:  112.8 kg (248 lb 10.9 oz)  112.2 kg (247 lb 5.7 oz)  SpO2: 95% 93% 96% 93%    General: older female in NAD (off O2), alert and oriented x3, Cardiovascular: RRR, nl Respiratory: moderate air mov't, no wheezes Extremities: no cyanosis, no edema  Discharge Instructions  Discharge Orders   Future Orders Complete By Expires     Diet - low sodium heart healthy  As directed     Increase activity slowly  As directed         Medication List    STOP taking these medications       ibuprofen 200 MG tablet  Commonly  known as:  ADVIL,MOTRIN      TAKE these medications       albuterol 108 (90 BASE) MCG/ACT inhaler  Commonly known as:  PROVENTIL HFA;VENTOLIN HFA  Inhale 2 puffs into the lungs every 4 (four) hours as needed for wheezing.     amitriptyline 25 MG tablet  Commonly known as:  ELAVIL  Take 25 mg by mouth at bedtime.     ARIPiprazole 10 MG tablet  Commonly known as:  ABILIFY  Take 10 mg by mouth daily.     Fluticasone-Salmeterol 250-50 MCG/DOSE Aepb  Commonly known as:  ADVAIR DISKUS  Inhale 1 puff into the lungs 2 (two) times daily.     levofloxacin 750 MG tablet  Commonly known as:  LEVAQUIN  Take 1 tablet (750 mg total) by mouth daily.     levothyroxine 125 MCG tablet  Commonly known as:  SYNTHROID, LEVOTHROID  Take 125 mcg by mouth daily.     omeprazole 20 MG capsule  Commonly known as:  PRILOSEC  Take 2 capsules (40 mg total) by mouth daily.     oxyCODONE-acetaminophen 10-325 MG per tablet  Commonly known as:  PERCOCET  Take 1 tablet by mouth every 8 (eight) hours as needed for pain.     predniSONE 20 MG  tablet  Commonly known as:  DELTASONE  Take 1 tablet (20 mg total) by mouth daily.     sertraline 100 MG tablet  Commonly known as:  ZOLOFT  Take 100 mg by mouth daily.     tiotropium 18 MCG inhalation capsule  Commonly known as:  SPIRIVA HANDIHALER  Place 1 capsule (18 mcg total) into inhaler and inhale daily.     triamterene-hydrochlorothiazide 37.5-25 MG per capsule  Commonly known as:  DYAZIDE  Take 1 capsule by mouth every morning.           Follow-up Information   Please follow up. (PCP  with [facility] in [place], call for appt upon discharge)        The results of significant diagnostics from this hospitalization (including imaging, microbiology, ancillary and laboratory) are listed below for reference.    Significant Diagnostic Studies: Dg Chest 2 View  01/17/2013  *RADIOLOGY REPORT*  Clinical Data: Chest pain.  Wheezing.   Tobacco use.  Hypertension.  CHEST - 2 VIEW  Comparison: 01/17/2013  Findings: Cardiothoracic index 54%, compatible with mild cardiomegaly.  There is tortuosity and potentially ectasia of the ascending thoracic aorta.  The lungs appear clear.  No pleural effusion observed.  IMPRESSION:  1.  Mild cardiomegaly. 2.  Tortuosity and potentially ectasia of the descending thoracic aorta.   Original Report Authenticated By: Gaylyn Rong, M.D.    Ct Angio Chest W/cm &/or Wo Cm  01/17/2013  *RADIOLOGY REPORT*  Clinical Data: 57 year old female with chest pain and shortness of breath.  CT ANGIOGRAPHY CHEST  Technique:  Multidetector CT imaging of the chest using the standard protocol during bolus administration of intravenous contrast. Multiplanar reconstructed images including MIPs were obtained and reviewed to evaluate the vascular anatomy.  Contrast: OMNIPAQUE IOHEXOL 350 MG/ML SOLN  Comparison: 01/17/2013 chest radiograph  Findings: This is a technically adequate study but motion artifact and suboptimal contrast opacification limits evaluation.  No definite pulmonary emboli are identified.  Moderate to heavy coronary artery calcifications are present. Ectasia of the ascending thoracic aorta noted measuring 3.8 cm in diameter.  There are no pleural or pericardial effusions present. No enlarged lymph nodes are present.  Minimal bibasilar atelectasis/scarring is noted.  Mild centrilobular emphysema is noted.  A 7 mm subpleural/peripheral nodule along the posterior right upper lobe (image 20) is present.  There is no evidence of pulmonary mass, airspace disease, consolidation or endobronchial/endotracheal lesion.  No acute or suspicious bony abnormalities are noted. Postoperative changes along the proximal stomach noted. There is a small hiatal hernia present.  IMPRESSION: No evidence of pulmonary emboli, but study is suboptimal as described above.  Coronary artery disease and ectatic ascending thoracic aorta.  7  mm posterior right upper lobe pulmonary nodule. If the patient is at high risk for bronchogenic carcinoma, follow-up chest CT at 3-6 months is recommended.  If the patient is at low risk for bronchogenic carcinoma, follow-up chest CT at 6-12 months is recommended.  This recommendation follows the consensus statement: Guidelines for Management of Small Pulmonary Nodules Detected on CT Scans: A Statement from the Fleischner Society as published in Radiology 2005; 237:395-400.  Centrilobular emphysema.  Small hiatal hernia.   Original Report Authenticated By: Harmon Pier, M.D.     Microbiology: No results found for this or any previous visit (from the past 240 hour(s)).   Labs: Basic Metabolic Panel:  Recent Labs Lab 01/17/13 1645  NA 135  K 3.8  CL 96  CO2  27  GLUCOSE 182*  BUN 12  CREATININE 0.65  CALCIUM 9.0   Liver Function Tests: No results found for this basename: AST, ALT, ALKPHOS, BILITOT, PROT, ALBUMIN,  in the last 168 hours No results found for this basename: LIPASE, AMYLASE,  in the last 168 hours No results found for this basename: AMMONIA,  in the last 168 hours CBC:  Recent Labs Lab 01/17/13 1645  WBC 9.9  HGB 10.9*  HCT 37.9  MCV 71.6*  PLT 305   Cardiac Enzymes:  Recent Labs Lab 01/17/13 1645 01/17/13 2056 01/18/13 0230 01/18/13 0855  TROPONINI <0.30 <0.30 <0.30 <0.30   BNP: BNP (last 3 results)  Recent Labs  01/17/13 2058  PROBNP 397.0*   CBG: No results found for this basename: GLUCAP,  in the last 168 hours     Signed:  Damek Ende C  [facility] 01/18/2013, 12:03 PM

## 2013-01-18 NOTE — Progress Notes (Signed)
Patient remains tremulous/jittery but this has decreased somewhat following IV ativan. This RN offered to notify [facility] on call to get further prn orders for jitters but patient refuses. States IV ativan made her "feel funny" and doesn't want to take any more of it just now. Will continue to monitor. Ginny Forth

## 2013-01-18 NOTE — Progress Notes (Signed)
   CARE MANAGEMENT NOTE 01/18/2013  Patient:  Jacqueline Moyer,Jacqueline Moyer   Account Number:  [id number]  Date Initiated:  01/18/2013  Documentation initiated by:  Jiles Crocker  Subjective/Objective Assessment:   ADMITTED WITH COPD     Action/Plan:   PCP IS [facility]; DR MARCUS BABAOFF  LIVES AT HOME WITH SPOUSE;   Anticipated DC Date:  01/22/2013   Anticipated DC Plan:  HOME/SELF CARE      DC Planning Services  CM consult          Status of service:  In process, will continue to follow Medicare Important Message given?  NA - LOS <3 / Initial given by admissions (If response is "NO", the following Medicare IM given date fields will be blank) Per UR Regulation:  Reviewed for med. necessity/level of care/duration of stay Comments:  01/18/2013- B Bing Duffey RN,BSN,MHA

## 2013-03-02 ENCOUNTER — Other Ambulatory Visit: Payer: Self-pay | Admitting: Pain Medicine

## 2013-03-02 DIAGNOSIS — M25559 Pain in unspecified hip: Secondary | ICD-10-CM

## 2013-03-02 DIAGNOSIS — M542 Cervicalgia: Secondary | ICD-10-CM

## 2013-03-10 ENCOUNTER — Ambulatory Visit
Admission: RE | Admit: 2013-03-10 | Discharge: 2013-03-10 | Disposition: A | Discharge status: Home / Self Care | Payer: BC Managed Care – PPO | Source: Ambulatory Visit | Attending: Pain Medicine | Admitting: Pain Medicine

## 2013-03-10 ENCOUNTER — Other Ambulatory Visit: Payer: Self-pay | Admitting: Pain Medicine

## 2013-03-10 DIAGNOSIS — M25559 Pain in unspecified hip: Secondary | ICD-10-CM

## 2013-03-10 DIAGNOSIS — M25552 Pain in left hip: Secondary | ICD-10-CM

## 2013-03-10 DIAGNOSIS — M25551 Pain in right hip: Secondary | ICD-10-CM

## 2013-03-10 DIAGNOSIS — M542 Cervicalgia: Secondary | ICD-10-CM

## 2013-07-13 ENCOUNTER — Encounter: Payer: Self-pay | Admitting: Gastroenterology

## 2013-07-26 ENCOUNTER — Ambulatory Visit: Payer: Medicare Other | Attending: Pain Medicine

## 2013-07-26 DIAGNOSIS — M545 Low back pain, unspecified: Secondary | ICD-10-CM | POA: Insufficient documentation

## 2013-07-26 DIAGNOSIS — R269 Unspecified abnormalities of gait and mobility: Secondary | ICD-10-CM | POA: Insufficient documentation

## 2013-07-26 DIAGNOSIS — IMO0001 Reserved for inherently not codable concepts without codable children: Secondary | ICD-10-CM | POA: Insufficient documentation

## 2013-07-26 DIAGNOSIS — R5381 Other malaise: Secondary | ICD-10-CM | POA: Insufficient documentation

## 2013-07-28 ENCOUNTER — Ambulatory Visit: Payer: Medicare Other | Admitting: Physical Therapy

## 2013-08-01 ENCOUNTER — Ambulatory Visit: Payer: Medicare Other | Admitting: Physical Therapy

## 2013-08-03 ENCOUNTER — Ambulatory Visit: Payer: Medicare Other | Admitting: Physical Therapy

## 2013-08-15 ENCOUNTER — Encounter: Payer: Medicare Other | Admitting: Physical Therapy

## 2013-08-17 ENCOUNTER — Encounter: Payer: Medicare Other | Admitting: Physical Therapy

## 2013-08-22 ENCOUNTER — Encounter: Payer: Medicare Other | Admitting: Physical Therapy

## 2013-08-29 ENCOUNTER — Ambulatory Visit: Payer: Medicare Other | Admitting: Physical Therapy

## 2013-11-07 ENCOUNTER — Other Ambulatory Visit: Payer: Self-pay | Admitting: Internal Medicine

## 2014-02-10 ENCOUNTER — Other Ambulatory Visit: Payer: Self-pay | Admitting: Obstetrics & Gynecology

## 2014-02-10 ENCOUNTER — Other Ambulatory Visit (HOSPITAL_COMMUNITY)
Admission: RE | Admit: 2014-02-10 | Discharge: 2014-02-10 | Disposition: A | Discharge status: Home / Self Care | Payer: Medicare Other | Source: Ambulatory Visit | Attending: Obstetrics & Gynecology | Admitting: Obstetrics & Gynecology

## 2014-02-10 DIAGNOSIS — Z124 Encounter for screening for malignant neoplasm of cervix: Secondary | ICD-10-CM | POA: Insufficient documentation

## 2014-02-10 DIAGNOSIS — Z1151 Encounter for screening for human papillomavirus (HPV): Secondary | ICD-10-CM | POA: Insufficient documentation

## 2014-03-13 ENCOUNTER — Encounter: Payer: Self-pay | Admitting: Gastroenterology

## 2014-04-04 ENCOUNTER — Ambulatory Visit (INDEPENDENT_AMBULATORY_CARE_PROVIDER_SITE_OTHER): Payer: Medicare Other | Admitting: General Surgery

## 2014-04-04 ENCOUNTER — Encounter (INDEPENDENT_AMBULATORY_CARE_PROVIDER_SITE_OTHER): Payer: Self-pay | Admitting: Surgery

## 2014-04-04 ENCOUNTER — Encounter (INDEPENDENT_AMBULATORY_CARE_PROVIDER_SITE_OTHER): Payer: Self-pay | Admitting: General Surgery

## 2014-04-04 VITALS — BP 126/84 | HR 90 | Temp 97.1°F | Ht 66.0 in | Wt 250.0 lb

## 2014-04-04 DIAGNOSIS — L02215 Cutaneous abscess of perineum: Secondary | ICD-10-CM

## 2014-04-04 DIAGNOSIS — L03319 Cellulitis of trunk, unspecified: Secondary | ICD-10-CM

## 2014-04-04 DIAGNOSIS — L02219 Cutaneous abscess of trunk, unspecified: Secondary | ICD-10-CM

## 2014-04-04 MED ORDER — DOXYCYCLINE HYCLATE 50 MG PO CAPS: 100.0000 mg | ORAL_CAPSULE | Freq: Two times a day (BID) | ORAL | Status: AC

## 2014-04-04 NOTE — Progress Notes (Signed)
Patient ID: Delayni Streed, female   DOB: [birth year], 57 y.o.   MRN: [id number]  Chief Complaint  Patient presents with  . perirectal abscess    HPI Ardys Hataway is a 58 y.o. female.  Who began having perineal pain about 5 days ago.  She has had some chills, but no fevers.  She has tried warm compresses to no avail.  Her pain has continued to worsen and this area has increased in size.  She saw her PCP who gave her a shot of Rocephin and referred her to [place].  HPI  Past Medical History  Diagnosis Date  . Hypertension   . Thyroid disease     hyperthyroidism  . Chronic back pain   . Chronic neck pain   . Depression   . Cancer approx 2004    colon cancer  . COPD (chronic obstructive pulmonary disease)   . Diabetes mellitus without complication   . Anemia     Past Surgical History  Procedure Laterality Date  . Cholecystectomy    . Tonsillectomy    . Abdominal hysterectomy    . Colon surgery  approx. 2004    for colon cancer  . Hernia repair      History reviewed. No pertinent family history.  Social History History  Substance Use Topics  . Smoking status: Former Smoker -- 40 years  . Smokeless tobacco: Former Systems developer    Quit date: 04/04/2013  . Alcohol Use: Yes     Comment: glass of wine daily    No Known Allergies  Current Outpatient Prescriptions  Medication Sig Dispense Refill  . albuterol (PROVENTIL HFA;VENTOLIN HFA) 108 (90 BASE) MCG/ACT inhaler Inhale 2 puffs into the lungs every 4 (four) hours as needed for wheezing.  1 Inhaler  2  . Fluticasone-Salmeterol (ADVAIR DISKUS) 250-50 MCG/DOSE AEPB Inhale 1 puff into the lungs 2 (two) times daily.  60 each  0  . levothyroxine (SYNTHROID, LEVOTHROID) 125 MCG tablet Take 125 mcg by mouth daily.      Marland Kitchen oxyCODONE-acetaminophen (PERCOCET) 10-325 MG per tablet Take 1 tablet by mouth every 8 (eight) hours as needed for pain.      Marland Kitchen sertraline (ZOLOFT) 100 MG tablet Take 100 mg by mouth daily.      Marland Kitchen tiotropium (SPIRIVA  HANDIHALER) 18 MCG inhalation capsule Place 1 capsule (18 mcg total) into inhaler and inhale daily.  30 capsule  12  . triamterene-hydrochlorothiazide (DYAZIDE) 37.5-25 MG per capsule Take 1 capsule by mouth every morning.      Marland Kitchen doxycycline (VIBRAMYCIN) 50 MG capsule Take 2 capsules (100 mg total) by mouth 2 (two) times daily.  40 capsule  0   No current facility-administered medications for this visit.    Review of Systems Review of Systems: Please see HPI, otherwise a limited review of systems is negative  Blood pressure 126/84, pulse 90, temperature 97.1 F (36.2 C), height 5\' 6"  (1.676 m), weight 250 lb (113.399 kg).  Physical Exam Physical Exam GU: large area of cellulitis and induration extending anteriorly in her perineum posterior into her right gluteus.  She has a small open with mostly serosang drainage.  There is some mild fluctuance noted around this area.  It was prepped with betadine and covered in the usual sterile fashion.  After verbal consent was obtained, 2% lidocaine with epi was used to anesthetize this area.  An 11 blade scalpel was used to incise an circular opening around the spontaneous opening.  This measured about 1.5x1.5cm in  size.  She has a small area of tracking up towards her 11 oclock anteriorly towards her labia.  No purulent drainage was encountered.  Hemostasis was achieved and the area was packed with 1/4in iodoform gauze.  The patient tolerated well  Data Reviewed Her PCP office notes were reviewed prior to evaluation  Assessment    1. Perineal abscess with cellulitis     Plan    1. I&D was performed in the clinic today.  I made this area large and open enough that she can remove the packing in 2 days and then begin sitz bathes so she does not have to do dressing changes at home.  She has also been given a prescription for doxycycline 100mg  BID for 10 days.  She will follow up with the urgent office next week for a wound check.  She is encouraged to  follow up with [place] soon if her cellulitis worsens, she develops fevers, or acutely worsens overall.  She understands all of this.        Henreitta Cea 04/04/2014, 3:39 PM

## 2014-04-04 NOTE — Patient Instructions (Signed)
Remove packing on Thursday.  Do Sitz bathes three times and a day and after bowel movements For fevers over 101, worsening pain, continued redness, please call our office to be seen sooner than your next scheduled appointment

## 2014-04-11 ENCOUNTER — Encounter (INDEPENDENT_AMBULATORY_CARE_PROVIDER_SITE_OTHER): Payer: Self-pay | Admitting: Surgery

## 2014-04-11 ENCOUNTER — Ambulatory Visit (INDEPENDENT_AMBULATORY_CARE_PROVIDER_SITE_OTHER): Payer: Medicare Other | Admitting: Surgery

## 2014-04-11 VITALS — BP 134/82 | HR 103 | Temp 97.2°F | Ht 66.0 in | Wt 245.0 lb

## 2014-04-11 DIAGNOSIS — L03319 Cellulitis of trunk, unspecified: Secondary | ICD-10-CM

## 2014-04-11 DIAGNOSIS — L02219 Cutaneous abscess of trunk, unspecified: Secondary | ICD-10-CM

## 2014-04-11 DIAGNOSIS — L02215 Cutaneous abscess of perineum: Secondary | ICD-10-CM

## 2014-04-11 NOTE — Progress Notes (Signed)
Patient returns one-week after incision and drainage of perineal abscess. This was done last week. She feels better.  Exam: Wound to right posterior perineum open and clean. It measures about a centimeter in maximal diameter. No signs of pus or abscess. No erythema.  Impression: One-week status post drainage of perineal abscess improved  Plan: Finish antibiotics. Keep clean with soap and water. Apply dry dressing. Return as needed.

## 2014-04-11 NOTE — Patient Instructions (Signed)
Finish antibiotics.  Return as needed.

## 2014-05-12 ENCOUNTER — Encounter: Payer: Self-pay | Admitting: Gastroenterology

## 2014-06-14 ENCOUNTER — Ambulatory Visit (AMBULATORY_SURGERY_CENTER): Payer: Self-pay | Admitting: *Deleted

## 2014-06-14 VITALS — Ht 67.0 in | Wt 236.8 lb

## 2014-06-14 DIAGNOSIS — Z85038 Personal history of other malignant neoplasm of large intestine: Secondary | ICD-10-CM

## 2014-06-14 MED ORDER — MOVIPREP 100 G PO SOLR: ORAL | Status: DC

## 2014-06-14 NOTE — Progress Notes (Signed)
Patient denies any allergies to eggs or soy. Patient denies any problems with anesthesia/sedation. Patient denies any oxygen use at home and does not take any diet/weight loss medications. EMMI education assisgned to patient on colonoscopy, this was explained and instructions given to patient. Patient came into pre-visit today, she is c/o "diarrhea,nausea, stomach and colon pain, and upset stomach since May", she states "everytime I eat I feel bad and have to lay down". Patient request upper EGD, explained to patient that she will need office visit before scheduling EGD, she does not want me to make that office visit at this time. Pt states she will talk with Dr.Jacobs during her colonoscopy on 06/28/14.

## 2014-06-28 ENCOUNTER — Telehealth: Payer: Self-pay

## 2014-06-28 ENCOUNTER — Encounter: Payer: Self-pay | Admitting: Gastroenterology

## 2014-06-28 ENCOUNTER — Ambulatory Visit (AMBULATORY_SURGERY_CENTER): Payer: Medicare Other | Admitting: Gastroenterology

## 2014-06-28 VITALS — BP 144/86 | HR 73 | Temp 97.8°F | Resp 18 | Ht 67.0 in | Wt 236.0 lb

## 2014-06-28 DIAGNOSIS — Z538 Procedure and treatment not carried out for other reasons: Secondary | ICD-10-CM

## 2014-06-28 DIAGNOSIS — Z85038 Personal history of other malignant neoplasm of large intestine: Secondary | ICD-10-CM

## 2014-06-28 MED ORDER — SODIUM CHLORIDE 0.9 % IV SOLN: 500.0000 mL | INTRAVENOUS | Status: DC

## 2014-06-28 NOTE — Op Note (Signed)
[facility]  [address] [place], [id number]   COLONOSCOPY PROCEDURE REPORT  PATIENT: Jacqueline Moyer, Jacqueline Moyer  MR#: [id number] BIRTHDATE: 01-10-1956 , 22  yrs. old GENDER: Female ENDOSCOPIST: Milus Banister, MD PROCEDURE DATE:  06/28/2014 PROCEDURE:   Colonoscopy, diagnostic First Screening Colonoscopy - Avg.  risk and is 50 yrs.  old or older - No.  Prior Negative Screening - Now for repeat screening. N/A  History of Adenoma - Now for follow-up colonoscopy & has been > or = to 3 yrs.  N/A  Polyps Removed Today? No.  Recommend repeat exam, <10 yrs? Yes.  Inadequate prep. ASA CLASS:   Class II INDICATIONS:FLA, diagnosed with colon cancer in 2006.  Left sided...s/p surgery, had option for chemo, she opted against the chemo.  No lymphnodes.  Had colonoscopy in 2007 and in 2008 both were negative; colonoscopy Dr. Ardis Hughs 2009 found no adenomatous polyps, no cancer, normal left sided anastomosis. MEDICATIONS: MAC sedation, administered by CRNA and Propofol (Diprivan) 290 mg IV  DESCRIPTION OF PROCEDURE:   After the risks benefits and alternatives of the procedure were thoroughly explained, informed consent was obtained.  A digital rectal exam revealed no abnormalities of the rectum.   The LB [id number]  endoscope was introduced through the anus and advanced to the cecum, which was identified by both the appendix and ileocecal valve. No adverse events experienced.   The quality of the prep was poor.  The instrument was then slowly withdrawn as the colon was fully examined.   COLON FINDINGS: Poor prep greatly limited this examination.  There was adherent stool coating most of the right colon that could not be flushed from the wall.  The cecum was reached however.  A "fair" look at the left sided anastomosis found no obvious recurrence of cancer.  Retroflexed views revealed no abnormalities. The time to cecum=3 minutes 33 seconds.  Withdrawal time=5 minutes 27  seconds. The scope was withdrawn and the procedure completed. COMPLICATIONS: There were no complications.  ENDOSCOPIC IMPRESSION: Poor prep greatly limited this examination.  There was adherent stool coating most of the right colon that could not be flushed from the wall.  The cecum was reached however.  A "fair" look at the left sided anastomosis found no obvious recurrence of cancer.  RECOMMENDATIONS: You will need a repeat colonoscopy with "double prep" protocol. Signficant lesions could have been missed on this examination.   eSigned:  Milus Banister, MD 06/28/2014 10:45 AM

## 2014-06-28 NOTE — Patient Instructions (Signed)
Discharge instructions given with verbal understanding. Reschedule procedure. Resume previous medications. YOU HAD AN ENDOSCOPIC PROCEDURE TODAY AT [facility]: Refer to the procedure report that was given to you for any specific questions about what was found during the examination.  If the procedure report does not answer your questions, please call your gastroenterologist to clarify.  If you requested that your care partner not be given the details of your procedure findings, then the procedure report has been included in a sealed envelope for you to review at your convenience later.  YOU SHOULD EXPECT: Some feelings of bloating in the abdomen. Passage of more gas than usual.  Walking can help get rid of the air that was put into your GI tract during the procedure and reduce the bloating. If you had a lower endoscopy (such as a colonoscopy or flexible sigmoidoscopy) you may notice spotting of blood in your stool or on the toilet paper. If you underwent a bowel prep for your procedure, then you may not have a normal bowel movement for a few days.  DIET: Your first meal following the procedure should be a light meal and then it is ok to progress to your normal diet.  A half-sandwich or bowl of soup is an example of a good first meal.  Heavy or fried foods are harder to digest and may make you feel nauseous or bloated.  Likewise meals heavy in dairy and vegetables can cause extra gas to form and this can also increase the bloating.  Drink plenty of fluids but you should avoid alcoholic beverages for 24 hours.  ACTIVITY: Your care partner should take you home directly after the procedure.  You should plan to take it easy, moving slowly for the rest of the day.  You can resume normal activity the day after the procedure however you should NOT DRIVE or use heavy machinery for 24 hours (because of the sedation medicines used during the test).    SYMPTOMS TO REPORT IMMEDIATELY: A  gastroenterologist can be reached at any hour.  During normal business hours, 8:30 AM to 5:00 PM Monday through Friday, call [phone].  After hours and on weekends, please call the GI answering service at [phone] who will take a message and have the physician on call contact you.   Following lower endoscopy (colonoscopy or flexible sigmoidoscopy):  Excessive amounts of blood in the stool  Significant tenderness or worsening of abdominal pains  Swelling of the abdomen that is new, acute  Fever of 100F or higher  FOLLOW UP: If any biopsies were taken you will be contacted by phone or by letter within the next 1-3 weeks.  Call your gastroenterologist if you have not heard about the biopsies in 3 weeks.  Our staff will call the home number listed on your records the next business day following your procedure to check on you and address any questions or concerns that you may have at that time regarding the information given to you following your procedure. This is a courtesy call and so if there is no answer at the home number and we have not heard from you through the emergency physician on call, we will assume that you have returned to your regular daily activities without incident.  SIGNATURES/CONFIDENTIALITY: You and/or your care partner have signed paperwork which will be entered into your electronic medical record.  These signatures attest to the fact that that the information above on your After Visit Summary has been reviewed  and is understood.  Full responsibility of the confidentiality of this discharge information lies with you and/or your care-partner. 

## 2014-06-28 NOTE — Progress Notes (Signed)
A/ox3, pleased with MAC, report to RN 

## 2014-06-28 NOTE — Progress Notes (Signed)
Initial bp taken per automatic b/p monitor, 164/120 left, 178/104 right. Manual large cuff left side 178/98.

## 2014-06-28 NOTE — Progress Notes (Signed)
Spoke with Dr.Jacobs about "double prep" for patient's repeat colonoscopy. He states to give patient "Miralax 1 bottle (238 GM) in 64 oz Gatorade 2 days prior to procedure and clear liquids as protocol, then Split dose Miralax/doculax as per protocol day prior to procedure. May also send in RX for Zofran 4 mg po 4 pills, may take 1 pill prn nausea and repeat x 1 the day before and also the day of procedure with prep". V/O Dr.Jacobs/Robbin Myers,RN.

## 2014-06-28 NOTE — Telephone Encounter (Signed)
You will need a repeat colonoscopy with "double prep" protocol.  Signficant lesions could have been missed on this examination.

## 2014-06-29 ENCOUNTER — Telehealth: Payer: Self-pay | Admitting: *Deleted

## 2014-06-29 NOTE — Telephone Encounter (Signed)
The pt has been scheduled and is aware

## 2014-06-29 NOTE — Telephone Encounter (Signed)
  Follow up Call-  Call back number 06/28/2014  Post procedure Call Back phone  # [phone]  Permission to leave phone message Yes     Patient questions:  Do you have a fever, pain , or abdominal swelling? No. Pain Score  0 *  Have you tolerated food without any problems? Yes.    Have you been able to return to your normal activities? Yes.    Do you have any questions about your discharge instructions: Diet   No. Medications  No. Follow up visit  No.  Do you have questions or concerns about your Care? No.  Actions: * If pain score is 4 or above: No action needed, pain <4.

## 2014-07-21 NOTE — Progress Notes (Signed)
    Milus Banister, MD Delos Haring, RN            Have her start the miralax at 5pm (2 nights before her procedure), thanks

## 2014-07-25 ENCOUNTER — Ambulatory Visit (AMBULATORY_SURGERY_CENTER): Payer: Self-pay | Admitting: *Deleted

## 2014-07-25 VITALS — Ht 66.0 in | Wt 227.6 lb

## 2014-07-25 DIAGNOSIS — Z85528 Personal history of other malignant neoplasm of kidney: Secondary | ICD-10-CM

## 2014-07-25 MED ORDER — ONDANSETRON HCL 4 MG PO TABS: 4.0000 mg | ORAL_TABLET | Freq: Three times a day (TID) | ORAL | Status: DC | PRN

## 2014-07-25 NOTE — Progress Notes (Signed)
No problems with past sedation. ewm No home 02 use. ewm Pt declined emmi video. ewm Pt to do 2 day prep with miralax and gatorade, dulcolax and zofran for this procedure with clear liquids sun thru tues as directed per dr Ardis Hughs. ewm

## 2014-08-02 ENCOUNTER — Telehealth: Payer: Self-pay | Admitting: Gastroenterology

## 2014-08-02 ENCOUNTER — Other Ambulatory Visit: Payer: Self-pay

## 2014-08-02 DIAGNOSIS — Z85528 Personal history of other malignant neoplasm of kidney: Secondary | ICD-10-CM

## 2014-08-02 MED ORDER — ONDANSETRON HCL 4 MG PO TABS: 4.0000 mg | ORAL_TABLET | Freq: Three times a day (TID) | ORAL | Status: DC | PRN

## 2014-08-02 NOTE — Telephone Encounter (Signed)
Rx resubmitted. Patient notified.

## 2014-08-03 NOTE — Telephone Encounter (Signed)
Spoke with pharmacy about instructions for the zofran day before and day of procedure.  He just wanted to make sure the directions was correct, one two days before and day before  the procedure as needed with prep and one the day of the procedure with the prep. Instructed yes was correct and instructed him pt has specific instructions as well given at [facility]. E Magalie Almon rn

## 2014-08-08 ENCOUNTER — Ambulatory Visit (AMBULATORY_SURGERY_CENTER): Payer: Medicare Other | Admitting: Gastroenterology

## 2014-08-08 ENCOUNTER — Encounter: Payer: Self-pay | Admitting: Gastroenterology

## 2014-08-08 VITALS — BP 137/87 | HR 68 | Temp 97.3°F | Resp 19 | Ht 66.0 in | Wt 227.0 lb

## 2014-08-08 DIAGNOSIS — D124 Benign neoplasm of descending colon: Secondary | ICD-10-CM

## 2014-08-08 DIAGNOSIS — D126 Benign neoplasm of colon, unspecified: Secondary | ICD-10-CM

## 2014-08-08 DIAGNOSIS — Z85038 Personal history of other malignant neoplasm of large intestine: Secondary | ICD-10-CM

## 2014-08-08 MED ORDER — SODIUM CHLORIDE 0.9 % IV SOLN: 500.0000 mL | INTRAVENOUS | Status: DC

## 2014-08-08 NOTE — Progress Notes (Signed)
A/ox3 pleased with MAC, report to April RN 

## 2014-08-08 NOTE — Patient Instructions (Signed)
YOU HAD AN ENDOSCOPIC PROCEDURE TODAY AT THE [facility]: Refer to the procedure report that was given to you for any specific questions about what was found during the examination.  If the procedure report does not answer your questions, please call your gastroenterologist to clarify.  If you requested that your care partner not be given the details of your procedure findings, then the procedure report has been included in a sealed envelope for you to review at your convenience later.  YOU SHOULD EXPECT: Some feelings of bloating in the abdomen. Passage of more gas than usual.  Walking can help get rid of the air that was put into your GI tract during the procedure and reduce the bloating. If you had a lower endoscopy (such as a colonoscopy or flexible sigmoidoscopy) you may notice spotting of blood in your stool or on the toilet paper. If you underwent a bowel prep for your procedure, then you may not have a normal bowel movement for a few days.  DIET: Your first meal following the procedure should be a light meal and then it is ok to progress to your normal diet.  A half-sandwich or bowl of soup is an example of a good first meal.  Heavy or fried foods are harder to digest and may make you feel nauseous or bloated.  Likewise meals heavy in dairy and vegetables can cause extra gas to form and this can also increase the bloating.  Drink plenty of fluids but you should avoid alcoholic beverages for 24 hours.  ACTIVITY: Your care partner should take you home directly after the procedure.  You should plan to take it easy, moving slowly for the rest of the day.  You can resume normal activity the day after the procedure however you should NOT DRIVE or use heavy machinery for 24 hours (because of the sedation medicines used during the test).    SYMPTOMS TO REPORT IMMEDIATELY: A gastroenterologist can be reached at any hour.  During normal business hours, 8:30 AM to 5:00 PM Monday through Friday,  call [phone].  After hours and on weekends, please call the GI answering service at [phone] who will take a message and have the physician on call contact you.   Following lower endoscopy (colonoscopy or flexible sigmoidoscopy):  Excessive amounts of blood in the stool  Significant tenderness or worsening of abdominal pains  Swelling of the abdomen that is new, acute  Fever of 100F or higher   FOLLOW UP: If any biopsies were taken you will be contacted by phone or by letter within the next 1-3 weeks.  Call your gastroenterologist if you have not heard about the biopsies in 3 weeks.  Our staff will call the home number listed on your records the next business day following your procedure to check on you and address any questions or concerns that you may have at that time regarding the information given to you following your procedure. This is a courtesy call and so if there is no answer at the home number and we have not heard from you through the emergency physician on call, we will assume that you have returned to your regular daily activities without incident.  SIGNATURES/CONFIDENTIALITY: You and/or your care partner have signed paperwork which will be entered into your electronic medical record.  These signatures attest to the fact that that the information above on your After Visit Summary has been reviewed and is understood.  Full responsibility of the confidentiality of   this discharge information lies with you and/or your care-partner.  Polyp-handout given  Repeat colonoscopy will be determined by pathology   

## 2014-08-08 NOTE — Progress Notes (Signed)
Called to room to assist during endoscopic procedure.  Patient ID and intended procedure confirmed with present staff. Received instructions for my participation in the procedure from the performing physician.  

## 2014-08-08 NOTE — Op Note (Signed)
[facility]  [address] [place], [id number]   COLONOSCOPY PROCEDURE REPORT  PATIENT: Jacqueline Moyer, Jacqueline Moyer  MR#: [id number] BIRTHDATE: [birth year] , 16  yrs. old GENDER: Female ENDOSCOPIST: Milus Banister, MD PROCEDURE DATE:  08/08/2014 PROCEDURE:   Colonoscopy with snare polypectomy First Screening Colonoscopy - Avg.  risk and is 50 yrs.  old or older - No.  Prior Negative Screening - Now for repeat screening. N/A  History of Adenoma - Now for follow-up colonoscopy & has been > or = to 3 yrs.  N/A  Polyps Removed Today? Yes. ASA CLASS:   Class II INDICATIONS:FLA. diagnosed with colon cancer in 2006. Left sided...s/p surgery, had option for chemo, she opted against the chemo. No lymphnodes. Had colonoscopy in 2007 and in 2008 both were negative; colonoscopy Dr. Ardis Hughs 2009 found no adenomatous polyps, no cancer, normal left sided anastomosis. MEDICATIONS: MAC sedation, administered by CRNA and propofol (Diprivan) 250mg  IV  DESCRIPTION OF PROCEDURE:   After the risks benefits and alternatives of the procedure were thoroughly explained, informed consent was obtained.  A digital rectal exam revealed no abnormalities of the rectum.   The LB [id number]  endoscope was introduced through the anus and advanced to the cecum, which was identified by both the appendix and ileocecal valve. No adverse events experienced.   The quality of the prep was excellent.  The instrument was then slowly withdrawn as the colon was fully examined.  COLON FINDINGS: One polyp was found, removed and sent to pathology. This was sessile, 22mm across, located in descending segment, removed with cold snare.  The left sided side to side anastomosis was normal appearing.  The examination was otherwise normal. Retroflexed views revealed no abnormalities. The time to cecum=4 minutes 30 seconds.  Withdrawal time=6 minutes 10 seconds.  The scope was withdrawn and the procedure  completed. COMPLICATIONS: There were no complications.  ENDOSCOPIC IMPRESSION: One polyp was found, removed and sent to pathology. The left sided side to side anastomosis was normal appearing.  The examination was otherwise normal.  RECOMMENDATIONS: If the polyp(s) removed today are proven to be adenomatous (pre-cancerous) polyps, you will need a repeat colonoscopy in 5 years.  You will receive a letter within 1-2 weeks with the results of your biopsy as well as final recommendations.  Please call my office if you have not received a letter after 3 weeks.  eSigned:  Milus Banister, MD 08/08/2014 3:04 PM   c

## 2014-08-09 ENCOUNTER — Telehealth: Payer: Self-pay | Admitting: *Deleted

## 2014-08-09 NOTE — Telephone Encounter (Signed)
No answer. Number identifier. Message left to call if any questions or concerns. 

## 2014-08-16 ENCOUNTER — Encounter: Payer: Self-pay | Admitting: Gastroenterology

## 2014-09-14 ENCOUNTER — Other Ambulatory Visit: Payer: Self-pay | Admitting: Pain Medicine

## 2014-09-14 DIAGNOSIS — M160 Bilateral primary osteoarthritis of hip: Secondary | ICD-10-CM

## 2014-09-22 ENCOUNTER — Ambulatory Visit
Admission: RE | Admit: 2014-09-22 | Discharge: 2014-09-22 | Disposition: A | Discharge status: Home / Self Care | Payer: Medicare Other | Source: Ambulatory Visit | Attending: Pain Medicine | Admitting: Pain Medicine

## 2014-09-22 DIAGNOSIS — M160 Bilateral primary osteoarthritis of hip: Secondary | ICD-10-CM

## 2014-11-19 ENCOUNTER — Inpatient Hospital Stay (HOSPITAL_COMMUNITY): Payer: Medicare Other

## 2014-11-19 ENCOUNTER — Inpatient Hospital Stay (HOSPITAL_COMMUNITY)
Admission: EM | Admit: 2014-11-19 | Discharge: 2014-11-21 | DRG: 689 | Disposition: A | Discharge status: Home / Self Care | Payer: Medicare Other | Attending: Internal Medicine | Admitting: Internal Medicine

## 2014-11-19 ENCOUNTER — Encounter (HOSPITAL_COMMUNITY): Payer: Self-pay | Admitting: Emergency Medicine

## 2014-11-19 ENCOUNTER — Emergency Department (HOSPITAL_COMMUNITY): Payer: Medicare Other

## 2014-11-19 DIAGNOSIS — E43 Unspecified severe protein-calorie malnutrition: Secondary | ICD-10-CM | POA: Insufficient documentation

## 2014-11-19 DIAGNOSIS — E876 Hypokalemia: Secondary | ICD-10-CM | POA: Diagnosis present

## 2014-11-19 DIAGNOSIS — M549 Dorsalgia, unspecified: Secondary | ICD-10-CM | POA: Diagnosis present

## 2014-11-19 DIAGNOSIS — Z800 Family history of malignant neoplasm of digestive organs: Secondary | ICD-10-CM

## 2014-11-19 DIAGNOSIS — R1031 Right lower quadrant pain: Secondary | ICD-10-CM

## 2014-11-19 DIAGNOSIS — E871 Hypo-osmolality and hyponatremia: Secondary | ICD-10-CM | POA: Diagnosis present

## 2014-11-19 DIAGNOSIS — Z6827 Body mass index (BMI) 27.0-27.9, adult: Secondary | ICD-10-CM | POA: Diagnosis not present

## 2014-11-19 DIAGNOSIS — E860 Dehydration: Secondary | ICD-10-CM | POA: Diagnosis not present

## 2014-11-19 DIAGNOSIS — F17210 Nicotine dependence, cigarettes, uncomplicated: Secondary | ICD-10-CM | POA: Diagnosis present

## 2014-11-19 DIAGNOSIS — G8929 Other chronic pain: Secondary | ICD-10-CM | POA: Diagnosis present

## 2014-11-19 DIAGNOSIS — Z79899 Other long term (current) drug therapy: Secondary | ICD-10-CM | POA: Diagnosis not present

## 2014-11-19 DIAGNOSIS — N390 Urinary tract infection, site not specified: Secondary | ICD-10-CM | POA: Diagnosis present

## 2014-11-19 DIAGNOSIS — R109 Unspecified abdominal pain: Secondary | ICD-10-CM | POA: Diagnosis present

## 2014-11-19 DIAGNOSIS — Z9049 Acquired absence of other specified parts of digestive tract: Secondary | ICD-10-CM | POA: Diagnosis present

## 2014-11-19 DIAGNOSIS — I10 Essential (primary) hypertension: Secondary | ICD-10-CM | POA: Diagnosis present

## 2014-11-19 DIAGNOSIS — Z85038 Personal history of other malignant neoplasm of large intestine: Secondary | ICD-10-CM | POA: Diagnosis not present

## 2014-11-19 DIAGNOSIS — J449 Chronic obstructive pulmonary disease, unspecified: Secondary | ICD-10-CM | POA: Diagnosis present

## 2014-11-19 DIAGNOSIS — F329 Major depressive disorder, single episode, unspecified: Secondary | ICD-10-CM | POA: Diagnosis present

## 2014-11-19 DIAGNOSIS — Z9884 Bariatric surgery status: Secondary | ICD-10-CM

## 2014-11-19 DIAGNOSIS — E039 Hypothyroidism, unspecified: Secondary | ICD-10-CM | POA: Diagnosis present

## 2014-11-19 DIAGNOSIS — M542 Cervicalgia: Secondary | ICD-10-CM | POA: Diagnosis present

## 2014-11-19 DIAGNOSIS — K219 Gastro-esophageal reflux disease without esophagitis: Secondary | ICD-10-CM | POA: Diagnosis present

## 2014-11-19 DIAGNOSIS — R1110 Vomiting, unspecified: Secondary | ICD-10-CM

## 2014-11-19 LAB — COMPREHENSIVE METABOLIC PANEL
ALT: 13 U/L (ref 0–35)
AST: 19 U/L (ref 0–37)
Albumin: 3.5 g/dL (ref 3.5–5.2)
Alkaline Phosphatase: 110 U/L (ref 39–117)
Anion gap: 13 (ref 5–15)
BUN: 25 mg/dL — ABNORMAL HIGH (ref 6–23)
CO2: 39 mmol/L — ABNORMAL HIGH (ref 19–32)
Calcium: 8.7 mg/dL (ref 8.4–10.5)
Chloride: 79 mEq/L — ABNORMAL LOW (ref 96–112)
Creatinine, Ser: 1.09 mg/dL (ref 0.50–1.10)
GFR calc Af Amer: 64 mL/min — ABNORMAL LOW (ref >90)
GFR calc non Af Amer: 55 mL/min — ABNORMAL LOW (ref >90)
Glucose, Bld: 108 mg/dL — ABNORMAL HIGH (ref 70–99)
Potassium: 2.2 mmol/L — CL (ref 3.5–5.1)
Sodium: 131 mmol/L — ABNORMAL LOW (ref 135–145)
Total Bilirubin: 1 mg/dL (ref 0.3–1.2)
Total Protein: 6.7 g/dL (ref 6.0–8.3)

## 2014-11-19 LAB — URINALYSIS, ROUTINE W REFLEX MICROSCOPIC
Glucose, UA: NEGATIVE mg/dL
Hgb urine dipstick: NEGATIVE
Ketones, ur: 15 mg/dL — AB
Nitrite: POSITIVE — AB
Protein, ur: 30 mg/dL — AB
Specific Gravity, Urine: 1.021 (ref 1.005–1.030)
Urobilinogen, UA: 1 mg/dL (ref 0.0–1.0)
pH: 6.5 (ref 5.0–8.0)

## 2014-11-19 LAB — CBC WITH DIFFERENTIAL/PLATELET
Basophils Absolute: 0 10*3/uL (ref 0.0–0.1)
Basophils Relative: 0 % (ref 0–1)
Eosinophils Absolute: 0 10*3/uL (ref 0.0–0.7)
Eosinophils Relative: 0 % (ref 0–5)
HCT: 44.6 % (ref 36.0–46.0)
Hemoglobin: 13.8 g/dL (ref 12.0–15.0)
Lymphocytes Relative: 16 % (ref 12–46)
Lymphs Abs: 2 10*3/uL (ref 0.7–4.0)
MCH: 24.4 pg — ABNORMAL LOW (ref 26.0–34.0)
MCHC: 30.9 g/dL (ref 30.0–36.0)
MCV: 78.8 fL (ref 78.0–100.0)
Monocytes Absolute: 1.1 10*3/uL — ABNORMAL HIGH (ref 0.1–1.0)
Monocytes Relative: 9 % (ref 3–12)
Neutro Abs: 9.6 10*3/uL — ABNORMAL HIGH (ref 1.7–7.7)
Neutrophils Relative %: 75 % (ref 43–77)
Platelets: 494 10*3/uL — ABNORMAL HIGH (ref 150–400)
RBC: 5.66 MIL/uL — ABNORMAL HIGH (ref 3.87–5.11)
RDW: 19.5 % — ABNORMAL HIGH (ref 11.5–15.5)
WBC: 12.7 10*3/uL — ABNORMAL HIGH (ref 4.0–10.5)

## 2014-11-19 LAB — I-STAT TROPONIN, ED: Troponin i, poc: 0.02 ng/mL (ref 0.00–0.08)

## 2014-11-19 LAB — URINE MICROSCOPIC-ADD ON

## 2014-11-19 LAB — LIPASE, BLOOD: Lipase: 24 U/L (ref 11–59)

## 2014-11-19 LAB — BRAIN NATRIURETIC PEPTIDE: B Natriuretic Peptide: 141.8 pg/mL — ABNORMAL HIGH (ref 0.0–100.0)

## 2014-11-19 LAB — I-STAT CG4 LACTIC ACID, ED: LACTIC ACID, VENOUS: 1.21 mmol/L (ref 0.5–2.2)

## 2014-11-19 LAB — MAGNESIUM: Magnesium: 1.8 mg/dL (ref 1.5–2.5)

## 2014-11-19 MED ORDER — IOHEXOL 300 MG/ML  SOLN: 50.0000 mL | Freq: Once | INTRAMUSCULAR | Status: AC | PRN

## 2014-11-19 MED ORDER — POTASSIUM CHLORIDE 10 MEQ/100ML IV SOLN
10.0000 meq | INTRAVENOUS | Status: DC
  Filled 2014-11-19: qty 100

## 2014-11-19 MED ORDER — ONDANSETRON HCL 4 MG/2ML IJ SOLN
4.0000 mg | Freq: Once | INTRAMUSCULAR | Status: AC
  Administered 2014-11-19: 4 mg via INTRAVENOUS
  Filled 2014-11-19: qty 2

## 2014-11-19 MED ORDER — PROMETHAZINE HCL 25 MG/ML IJ SOLN
12.5000 mg | Freq: Once | INTRAMUSCULAR | Status: AC
  Administered 2014-11-19: 12.5 mg via INTRAVENOUS
  Filled 2014-11-19: qty 1

## 2014-11-19 MED ORDER — DEXTROSE 5 % IV SOLN
1.0000 g | Freq: Once | INTRAVENOUS | Status: AC
  Administered 2014-11-19: 1 g via INTRAVENOUS
  Filled 2014-11-19: qty 10

## 2014-11-19 NOTE — ED Notes (Signed)
Performed I stat Trop.

## 2014-11-19 NOTE — H&P (Signed)
PATIENT DETAILS Name: Jacqueline Moyer Age: 58 y.o. Sex: female Date of Birth: 09-Sep-1956 Admit Date: 11/19/2014 BWG:YKZLD, ADAKU, MD   CHIEF COMPLAINT:  Abdominal pain, nausea, vomiting for the past 2 weeks  HPI: Jacqueline Moyer is a 58 y.o. female with a Past Medical History of colon cancer status post colectomy in 2006, hypothyroidism, hypertension who presents today with the above noted complaint. Per patient, for the past 2 weeks she has just not been feeling good, she claims that she is developed right mid quadrant abdominal pain along with nausea, intermittent vomiting. She claims that because of persistent vomiting and abdominal pain she is no longer able to tolerate any diet, and has been mostly on clear liquids over the past few days. She claims that she has lost approximately 20 pounds in the past few months which is unintentional. Because of persistent abdominal pain, nausea and vomiting she presented to the emergency room, where she was found to have significant hypokalemia, I was asked to admit this patient for further evaluation and treatment. Per patient, approximately 2 weeks back she saw her primary care doctor who prescribed her Lasix in addition to Maxzide for worsening lower extremity edema. Per patient, for the past few days she has had some generalized weakness as well. Denies any fever, but does claim to have had intermittent chills over the past few days. During my evaluation patient denied any chest pain, however apparently she did have some right-sided chest discomfort early in the morning that has since resolved and has not reoccurred since then.   ALLERGIES:  No Known Allergies  PAST MEDICAL HISTORY: Past Medical History  Diagnosis Date  . Hypertension   . Thyroid disease     hyperthyroidism  . Chronic back pain   . Chronic neck pain   . Depression   . COPD (chronic obstructive pulmonary disease)   . Anemia   . Cancer approx 2006    colon cancer   . Blood transfusion without reported diagnosis     PAST SURGICAL HISTORY: Past Surgical History  Procedure Laterality Date  . Cholecystectomy    . Tonsillectomy    . Abdominal hysterectomy    . Hernia repair    . Colon surgery  approx. 2006    for colon cancer  . Gastric bypass  2000    MEDICATIONS AT HOME: Prior to Admission medications   Medication Sig Start Date End Date Taking? Authorizing Provider  furosemide (LASIX) 40 MG tablet Take 1 tablet by mouth daily. For 4 days 11/14/14  Yes Historical Provider, MD  levothyroxine (SYNTHROID, LEVOTHROID) 125 MCG tablet Take 125 mcg by mouth daily.   Yes Historical Provider, MD  omeprazole (PRILOSEC) 20 MG capsule Take 20 mg by mouth daily.   Yes Historical Provider, MD  ondansetron (ZOFRAN) 4 MG tablet Take 1 tablet (4 mg total) by mouth every 8 (eight) hours as needed for nausea or vomiting (1 pill by mouth as needed for nausea/vomiting x1, then repeat x1 the day before the procedure and also the day of the procedure with prep x1). 08/02/14  Yes Milus Banister, MD  oxyCODONE-acetaminophen (PERCOCET) 10-325 MG per tablet Take 1 tablet by mouth every 4 (four) hours as needed for pain.  06/15/14  Yes Historical Provider, MD  sertraline (ZOLOFT) 100 MG tablet Take 100 mg by mouth every other day.    Yes Historical Provider, MD  triamterene-hydrochlorothiazide (DYAZIDE) 37.5-25 MG per capsule Take 1 capsule by mouth every  morning.   Yes Historical Provider, MD    FAMILY HISTORY: Family History  Problem Relation Age of Onset  . Colon cancer Father 88    SOCIAL HISTORY:  reports that she has been smoking.  She quit smokeless tobacco use about 19 months ago. She reports that she does not drink alcohol or use illicit drugs.  REVIEW OF SYSTEMS:  Constitutional:   No  night sweats,  Fevers,  HEENT:    No headaches, Difficulty swallowing,Tooth/dental problems,Sore throat,    Cardio-vascular: No chest pain,  Orthopnea, PND, anasarca,   dizziness, palpitations  GI:  No heartburn, indigestion,  change in bowel habits, loss of appetite  Resp: No shortness of breath with exertion or at rest.  No excess mucus, no productive cough, No non-productive cough,  No coughing up of blood.  Skin:  no rash or lesions.  GU:  no dysuria, change in color of urine, no urgency or frequency.  No flank pain.  Musculoskeletal: No joint pain or swelling.  No decreased range of motion.  No back pain.  Psych: No change in mood or affect. No depression or anxiety.  No memory loss.   PHYSICAL EXAM: Blood pressure 121/64, pulse 86, temperature 98.3 F (36.8 C), temperature source Oral, resp. rate 18, height 5' 5.5" (1.664 m), weight 77.111 kg (170 lb), SpO2 100 %.  General appearance :Awake, alert, not in any distress. Speech Clear. Not toxic Looking HEENT: Atraumatic and Normocephalic, pupils equally reactive to light and accomodation Neck: supple, no JVD. No cervical lymphadenopathy.  Chest:Good air entry bilaterally, no added sounds  CVS: S1 S2 regular, no murmurs.  Abdomen: Bowel sounds present, moderately tender in the right mid quadrant area, however no rebound rigidity or guarding. Abdomen is nondistended. Extremities: B/L Lower Ext shows trace edema, both legs are warm to touch Neurology:Non focal Skin:No Rash Wounds:N/A  LABS ON ADMISSION:   Recent Labs  11/19/14 2113 11/19/14 2252  NA 131*  --   K 2.2*  --   CL 79*  --   CO2 39*  --   GLUCOSE 108*  --   BUN 25*  --   CREATININE 1.09  --   CALCIUM 8.7  --   MG  --  1.8    Recent Labs  11/19/14 2113  AST 19  ALT 13  ALKPHOS 110  BILITOT 1.0  PROT 6.7  ALBUMIN 3.5    Recent Labs  11/19/14 2113  LIPASE 24    Recent Labs  11/19/14 2113  WBC 12.7*  NEUTROABS 9.6*  HGB 13.8  HCT 44.6  MCV 78.8  PLT 494*   No results for input(s): CKTOTAL, CKMB, CKMBINDEX, TROPONINI in the last 72 hours. No results for input(s): DDIMER in the last 72  hours. Invalid input(s): POCBNP   RADIOLOGIC STUDIES ON ADMISSION: Dg Abd Acute W/chest  11/19/2014   CLINICAL DATA:  Chest and abdominal pain for 2 weeks. Nausea, vomiting.  EXAM: ACUTE ABDOMEN SERIES (ABDOMEN 2 VIEW & CHEST 1 VIEW)  COMPARISON:  CT abdomen and pelvis 01/03/2009.  FINDINGS: Prior cholecystectomy. Nonobstructive bowel gas pattern. No free air organomegaly. 8 mm calcification projects over the midpole of the left kidney compatible with left renal stone as seen on prior abdominal CT.  Heart and mediastinal contours are within normal limits. No focal opacities or effusions. No acute bony abnormality.  IMPRESSION: Left nephrolithiasis, stable since 2010.  No acute findings.   Electronically Signed   By: Rolm Baptise M.D.  On: 11/19/2014 22:25    ASSESSMENT AND PLAN: Present on Admission:  . Hypokalemia: Multifactorial-secondary to nausea/vomiting and use of Lasix. Will give 4 runs of IV KCl, would also place on 40 mEq of potassium twice a day for 2 doses. Check magnesium level and repeat bmet in a.m. Will discontinue Lasix for now.   . Abdominal pain along with nausea/vomiting: Etiology not clear, given a history of malignancy a CT scan of the abdomen and pelvis has been ordered by ED MD, and it is pending. Lipase levels are normal. We will keep NPO, provide supportive care, await results of CT scan of the abdomen, and advance diet as tolerated. Not sure if UTI can explain the abdominal pain, she will be on empiric Rocephin treatment. Per patient, she is status post cholecystectomy and appendectomy  .? UTI (lower urinary tract infection): Urine analysis not a good sample-as there are a lot of squamous cells. Repeat UA. Continue empiric Rocephin for now.  Marland Kitchen GERD: Continue with PPI   . History of malignant neoplasm of large intestine: She recently had a colonoscopy in September of this year which showed a polyp-biopsy showed tubular adenoma . Await CT scan of the abdomen.   . Benign  essential HTN: Continue with Maxzide, holding his Lasix.   Marland Kitchen Hypothyroidism: Continue levothyroxine.  . Bilateral lower extremity edema: UA negative for proteinuria, LFTs essentially normal-check echocardiogram. Continue Maxide, hold Lasix given severe hypokalemia.   Further plan will depend as patient's clinical course evolves and further radiologic and laboratory data become available. Patient will be monitored closely.  Above noted plan was discussed with patient/daughter, they were in agreement.   DVT Prophylaxis: Prophylactic Lovenox  Code Status: Full Code  Disposition Plan: Home when medically stable   Total time spent for admission equals 45 minutes.  [facility] Pager [phone]  If 7PM-7AM, please contact night-coverage [email or web address] Password [facility] 11/19/2014, 11:58 PM

## 2014-11-19 NOTE — ED Notes (Signed)
Pt reports n/v for the past 2 weeks, and has lost 20 lbs in 10 days. Seen by PCP and placed on Lasix for leg swelling. Pt reports LBM today "but I've been pretty constipated." Pt reports sudden onset CP at [place], feeling pressure to the R side of her chest. Pt a&ox4, skin warm and dry, needed assistance from car for triage.

## 2014-11-19 NOTE — ED Provider Notes (Signed)
CSN: [id number]     Arrival date & time 11/19/14  2022 History   First MD Initiated Contact with Patient 11/19/14 2122     Chief Complaint  Patient presents with  . Chest Pain     (Consider location/radiation/quality/duration/timing/severity/associated sxs/prior Treatment) HPI Jacqueline Moyer is a 58 y.o. female with history of hypertension, thyroid disease, COPD, history of colon cancer in 2006, anemia, presents to emergency department complaining of weakness, nausea, vomiting, abdominal pain for 2 weeks. States in addition had episode of chest pain in the right side that started suddenly, [name] time she came here and has resolved. Denies any shortness of breath, cough, congestion, upper respiratory type symptoms. Patient states she went to see her primary care doctor about a week ago, states at that time they gave her Zofran for nausea, also started her on Lasix for bilateral leg swelling. States she has taken 4 days of Lasix, 40 mg each day, states swelling in her legs went down. Patient states she feels weak, dizzy, lightheaded. States "I just don't feel good." Denies any fever or chills. States diffuse abdominal pain. Recent colonoscopy, was told everything was normal. She states that she has lost 20 pounds in the last 2 weeks, about 100 pounds in the last 6 months. States this is unintentional. He does have history of gastric bypass in 2000.   Past Medical History  Diagnosis Date  . Hypertension   . Thyroid disease     hyperthyroidism  . Chronic back pain   . Chronic neck pain   . Depression   . COPD (chronic obstructive pulmonary disease)   . Anemia   . Cancer approx 2006    colon cancer  . Blood transfusion without reported diagnosis    Past Surgical History  Procedure Laterality Date  . Cholecystectomy    . Tonsillectomy    . Abdominal hysterectomy    . Hernia repair    . Colon surgery  approx. 2006    for colon cancer  . Gastric bypass  2000   Family History   Problem Relation Age of Onset  . Colon cancer Father 70   History  Substance Use Topics  . Smoking status: Current Some Day Smoker -- 40 years  . Smokeless tobacco: Former Systems developer    Quit date: 04/04/2013  . Alcohol Use: No   OB History    No data available     Review of Systems  Constitutional: Positive for activity change, appetite change, fatigue and unexpected weight change. Negative for fever and chills.  Respiratory: Positive for chest tightness. Negative for cough and shortness of breath.   Cardiovascular: Positive for chest pain. Negative for palpitations and leg swelling.  Gastrointestinal: Positive for nausea, vomiting, abdominal pain and constipation. Negative for diarrhea.  Genitourinary: Negative for dysuria, flank pain and pelvic pain.  Musculoskeletal: Positive for back pain. Negative for myalgias, arthralgias, neck pain and neck stiffness.  Skin: Negative for rash.  Neurological: Positive for dizziness, weakness and light-headedness. Negative for headaches.  All other systems reviewed and are negative.     Allergies  Review of patient's allergies indicates no known allergies.  Home Medications   Prior to Admission medications   Medication Sig Start Date End Date Taking? Authorizing Provider  furosemide (LASIX) 40 MG tablet Take 1 tablet by mouth daily. For 4 days 11/14/14  Yes Historical Provider, MD  levothyroxine (SYNTHROID, LEVOTHROID) 125 MCG tablet Take 125 mcg by mouth daily.   Yes Historical Provider, MD  omeprazole ([place])  20 MG capsule Take 20 mg by mouth daily.   Yes Historical Provider, MD  ondansetron (ZOFRAN) 4 MG tablet Take 1 tablet (4 mg total) by mouth every 8 (eight) hours as needed for nausea or vomiting (1 pill by mouth as needed for nausea/vomiting x1, then repeat x1 the day before the procedure and also the day of the procedure with prep x1). 08/02/14  Yes Milus Banister, MD  oxyCODONE-acetaminophen (PERCOCET) 10-325 MG per tablet Take 1  tablet by mouth every 4 (four) hours as needed for pain.  06/15/14  Yes Historical Provider, MD  sertraline (ZOLOFT) 100 MG tablet Take 100 mg by mouth every other day.    Yes Historical Provider, MD  triamterene-hydrochlorothiazide (DYAZIDE) 37.5-25 MG per capsule Take 1 capsule by mouth every morning.   Yes Historical Provider, MD   BP 130/82 mmHg  Pulse 91  Temp(Src) 98.3 F (36.8 C) (Oral)  Resp 20  Ht 5' 5.5" (1.664 m)  Wt 170 lb (77.111 kg)  BMI 27.85 kg/m2  SpO2 90% Physical Exam  Constitutional: She is oriented to person, place, and time. She appears well-developed and well-nourished. No distress.  HENT:  Head: Normocephalic.  oral mucosa dry  Eyes: Conjunctivae are normal.  Neck: Normal range of motion. Neck supple.  Cardiovascular: Normal rate, regular rhythm and normal heart sounds.   Pulmonary/Chest: Effort normal and breath sounds normal. No respiratory distress. She has no wheezes. She has no rales.  Abdominal: Soft. Bowel sounds are normal. She exhibits no distension. There is tenderness. There is no rebound.  RLQ tenderness  Musculoskeletal:  1+ edema of bilateral LE  Neurological: She is alert and oriented to person, place, and time.  Skin: Skin is warm and dry.  Psychiatric: She has a normal mood and affect. Her behavior is normal.  Nursing note and vitals reviewed.   ED Course  Procedures (including critical care time) Labs Review Labs Reviewed  CBC WITH DIFFERENTIAL - Abnormal; Notable for the following:    WBC 12.7 (*)    RBC 5.66 (*)    MCH 24.4 (*)    RDW 19.5 (*)    Platelets 494 (*)    Neutro Abs 9.6 (*)    Monocytes Absolute 1.1 (*)    All other components within normal limits  COMPREHENSIVE METABOLIC PANEL - Abnormal; Notable for the following:    Sodium 131 (*)    Potassium 2.2 (*)    Chloride 79 (*)    CO2 39 (*)    Glucose, Bld 108 (*)    BUN 25 (*)    GFR calc non Af Amer 55 (*)    GFR calc Af Amer 64 (*)    All other components  within normal limits  URINALYSIS, ROUTINE W REFLEX MICROSCOPIC - Abnormal; Notable for the following:    Color, Urine AMBER (*)    APPearance CLOUDY (*)    Bilirubin Urine MODERATE (*)    Ketones, ur 15 (*)    Protein, ur 30 (*)    Nitrite POSITIVE (*)    Leukocytes, UA MODERATE (*)    All other components within normal limits  URINE MICROSCOPIC-ADD ON - Abnormal; Notable for the following:    Squamous Epithelial / LPF MANY (*)    Bacteria, UA MANY (*)    All other components within normal limits  LIPASE, BLOOD  MAGNESIUM  I-STAT TROPOININ, ED  I-STAT CG4 LACTIC ACID, ED    Imaging Review Dg Abd Acute W/chest  11/19/2014  CLINICAL DATA:  Chest and abdominal pain for 2 weeks. Nausea, vomiting.  EXAM: ACUTE ABDOMEN SERIES (ABDOMEN 2 VIEW & CHEST 1 VIEW)  COMPARISON:  CT abdomen and pelvis 01/03/2009.  FINDINGS: Prior cholecystectomy. Nonobstructive bowel gas pattern. No free air organomegaly. 8 mm calcification projects over the midpole of the left kidney compatible with left renal stone as seen on prior abdominal CT.  Heart and mediastinal contours are within normal limits. No focal opacities or effusions. No acute bony abnormality.  IMPRESSION: Left nephrolithiasis, stable since 2010.  No acute findings.   Electronically Signed   By: Rolm Baptise M.D.   On: 11/19/2014 22:25     EKG Interpretation   Date/Time:  Sunday November 19 2014 20:37:59 EST Ventricular Rate:  79 PR Interval:  150 QRS Duration: 164 QT Interval:  519 QTC Calculation: 595 R Axis:   -177 Text Interpretation:  Sinus rhythm Probable left atrial enlargement  Nonspecific intraventricular conduction delay , new since last tracing ST  depr, consider ischemia, inferior leads , new since last tracing ST  depression V1-V3, suggest recording posterior leads Confirmed by KNAPP   MD-J, JON ([id number]) on 11/19/2014 8:53:13 PM      MDM   Final diagnoses:  Vomiting  Abdominal pain  Hypokalemia  Dehydration  UTI  (lower urinary tract infection)    Generalized weakness, nausea and vomiting for 2 weeks, unable to keep anything down area and states she hasn't eaten or drank any in several days. She is unable to keep any medications down. She has lost 20 pounds in the last 2 weeks. She has had recent Lasix prescription, states she took 4 days of it 40 mg a day. States it did decrease fluid in her legs. Patient does appear to be dry, abdomen is soft, diffusely tender. Vital signs are normal. Will get labs, IV fluids, Zofran, will get acute abdomen.   10:53 PM Potassium is 2.2, 3 ordered. Patient does not think she can tolerate any by mouth at this time. No relief and nausea with Zofran. We'll try Phenergan. Patient's IV infiltrated, will need a new one, RN is aware. Acute abdomen negative, will get CT abdomen and pelvis further evaluation of her abdominal pain, nausea, vomiting. Will call for admission.  11:11 PM Spoke with [facility]. Asked for blood cultures. Will admit.   Filed Vitals:   11/19/14 2031  BP: 130/82  Pulse: 91  Temp: 98.3 F (36.8 C)  TempSrc: Oral  Resp: 20  Height: 5' 5.5" (1.664 m)  Weight: 170 lb (77.111 kg)  SpO2: 90%     Renold Genta, PA-C 11/19/14 2311  Virgel Manifold, MD 11/20/14 [phone]

## 2014-11-20 ENCOUNTER — Inpatient Hospital Stay (HOSPITAL_COMMUNITY): Payer: Medicare Other

## 2014-11-20 ENCOUNTER — Encounter (HOSPITAL_COMMUNITY): Payer: Self-pay | Admitting: *Deleted

## 2014-11-20 DIAGNOSIS — E039 Hypothyroidism, unspecified: Secondary | ICD-10-CM

## 2014-11-20 DIAGNOSIS — R601 Generalized edema: Secondary | ICD-10-CM

## 2014-11-20 DIAGNOSIS — R609 Edema, unspecified: Secondary | ICD-10-CM

## 2014-11-20 DIAGNOSIS — I10 Essential (primary) hypertension: Secondary | ICD-10-CM

## 2014-11-20 LAB — CLOSTRIDIUM DIFFICILE BY PCR: CDIFFPCR: NEGATIVE

## 2014-11-20 LAB — CBC
HEMATOCRIT: 37.9 % (ref 36.0–46.0)
Hemoglobin: 11.6 g/dL — ABNORMAL LOW (ref 12.0–15.0)
MCH: 24.1 pg — AB (ref 26.0–34.0)
MCHC: 30.6 g/dL (ref 30.0–36.0)
MCV: 78.6 fL (ref 78.0–100.0)
PLATELETS: 393 10*3/uL (ref 150–400)
RBC: 4.82 MIL/uL (ref 3.87–5.11)
RDW: 19.6 % — ABNORMAL HIGH (ref 11.5–15.5)
WBC: 10.8 10*3/uL — ABNORMAL HIGH (ref 4.0–10.5)

## 2014-11-20 LAB — TSH: TSH: 0.082 u[IU]/mL — ABNORMAL LOW (ref 0.350–4.500)

## 2014-11-20 LAB — BASIC METABOLIC PANEL
ANION GAP: 9 (ref 5–15)
Anion gap: 9 (ref 5–15)
BUN: 23 mg/dL (ref 6–23)
BUN: 18 mg/dL (ref 6–23)
CO2: 36 mmol/L — AB (ref 19–32)
CO2: 34 mmol/L — AB (ref 19–32)
CREATININE: 0.85 mg/dL (ref 0.50–1.10)
Calcium: 7.5 mg/dL — ABNORMAL LOW (ref 8.4–10.5)
Calcium: 7.8 mg/dL — ABNORMAL LOW (ref 8.4–10.5)
Chloride: 85 mEq/L — ABNORMAL LOW (ref 96–112)
Chloride: 88 mEq/L — ABNORMAL LOW (ref 96–112)
Creatinine, Ser: 0.85 mg/dL (ref 0.50–1.10)
GFR calc Af Amer: 86 mL/min — ABNORMAL LOW (ref >90)
GFR calc Af Amer: 86 mL/min — ABNORMAL LOW (ref >90)
GFR calc non Af Amer: 75 mL/min — ABNORMAL LOW (ref >90)
GFR calc non Af Amer: 75 mL/min — ABNORMAL LOW (ref >90)
GLUCOSE: 75 mg/dL (ref 70–99)
GLUCOSE: 112 mg/dL — AB (ref 70–99)
POTASSIUM: 3.2 mmol/L — AB (ref 3.5–5.1)
Potassium: 2.1 mmol/L — CL (ref 3.5–5.1)
Sodium: 130 mmol/L — ABNORMAL LOW (ref 135–145)
Sodium: 131 mmol/L — ABNORMAL LOW (ref 135–145)

## 2014-11-20 LAB — PREGNANCY, URINE: Preg Test, Ur: NEGATIVE

## 2014-11-20 LAB — MAGNESIUM: Magnesium: 1.7 mg/dL (ref 1.5–2.5)

## 2014-11-20 MED ORDER — POTASSIUM CHLORIDE 2 MEQ/ML IV SOLN
INTRAVENOUS | Status: DC
  Administered 2014-11-20: 12:00:00 via INTRAVENOUS
  Filled 2014-11-20 (×2): qty 1000

## 2014-11-20 MED ORDER — CEFTRIAXONE SODIUM IN DEXTROSE 20 MG/ML IV SOLN
1.0000 g | INTRAVENOUS | Status: DC
  Administered 2014-11-20: 1 g via INTRAVENOUS
  Filled 2014-11-20: qty 50

## 2014-11-20 MED ORDER — POTASSIUM CHLORIDE CRYS ER 20 MEQ PO TBCR
60.0000 meq | EXTENDED_RELEASE_TABLET | Freq: Four times a day (QID) | ORAL | Status: DC
  Administered 2014-11-20 – 2014-11-21 (×6): 60 meq via ORAL
  Filled 2014-11-20 (×5): qty 3

## 2014-11-20 MED ORDER — ONDANSETRON HCL 4 MG PO TABS: 4.0000 mg | ORAL_TABLET | Freq: Four times a day (QID) | ORAL | Status: DC | PRN

## 2014-11-20 MED ORDER — LEVOTHYROXINE SODIUM 125 MCG PO TABS
125.0000 ug | ORAL_TABLET | Freq: Every day | ORAL | Status: DC
  Administered 2014-11-20 – 2014-11-21 (×2): 125 ug via ORAL
  Filled 2014-11-20 (×3): qty 1

## 2014-11-20 MED ORDER — PANTOPRAZOLE SODIUM 40 MG PO TBEC
40.0000 mg | DELAYED_RELEASE_TABLET | Freq: Every day | ORAL | Status: DC
  Administered 2014-11-20 – 2014-11-21 (×2): 40 mg via ORAL
  Filled 2014-11-20 (×2): qty 1

## 2014-11-20 MED ORDER — SERTRALINE HCL 100 MG PO TABS
100.0000 mg | ORAL_TABLET | ORAL | Status: DC
  Administered 2014-11-20: 100 mg via ORAL
  Filled 2014-11-20: qty 1

## 2014-11-20 MED ORDER — ENOXAPARIN SODIUM 40 MG/0.4ML ~~LOC~~ SOLN
40.0000 mg | SUBCUTANEOUS | Status: DC
  Administered 2014-11-20: 40 mg via SUBCUTANEOUS
  Filled 2014-11-20 (×2): qty 0.4

## 2014-11-20 MED ORDER — GUAIFENESIN-DM 100-10 MG/5ML PO SYRP: 5.0000 mL | ORAL_SOLUTION | ORAL | Status: DC | PRN

## 2014-11-20 MED ORDER — OXYCODONE-ACETAMINOPHEN 5-325 MG PO TABS: 1.0000 | ORAL_TABLET | Freq: Four times a day (QID) | ORAL | Status: DC | PRN

## 2014-11-20 MED ORDER — POTASSIUM CHLORIDE 10 MEQ/100ML IV SOLN
10.0000 meq | INTRAVENOUS | Status: AC
  Administered 2014-11-20 (×4): 10 meq via INTRAVENOUS
  Filled 2014-11-20 (×4): qty 100

## 2014-11-20 MED ORDER — POTASSIUM CHLORIDE 10 MEQ/100ML IV SOLN
10.0000 meq | INTRAVENOUS | Status: AC
  Administered 2014-11-20 (×2): 10 meq via INTRAVENOUS
  Filled 2014-11-20 (×4): qty 100

## 2014-11-20 MED ORDER — ACETAMINOPHEN 650 MG RE SUPP: 650.0000 mg | Freq: Four times a day (QID) | RECTAL | Status: DC | PRN

## 2014-11-20 MED ORDER — SODIUM CHLORIDE 0.9 % IV SOLN
INTRAVENOUS | Status: DC
  Administered 2014-11-20: 100 mL/h via INTRAVENOUS
  Administered 2014-11-20: 01:00:00 via INTRAVENOUS

## 2014-11-20 MED ORDER — MORPHINE SULFATE 2 MG/ML IJ SOLN
1.0000 mg | INTRAMUSCULAR | Status: DC | PRN
  Administered 2014-11-20: 1 mg via INTRAVENOUS
  Filled 2014-11-20: qty 1

## 2014-11-20 MED ORDER — POTASSIUM CHLORIDE CRYS ER 20 MEQ PO TBCR: 60.0000 meq | EXTENDED_RELEASE_TABLET | Freq: Four times a day (QID) | ORAL | Status: DC

## 2014-11-20 MED ORDER — IOHEXOL 300 MG/ML  SOLN
100.0000 mL | Freq: Once | INTRAMUSCULAR | Status: AC | PRN
  Administered 2014-11-20: 100 mL via INTRAVENOUS

## 2014-11-20 MED ORDER — ONDANSETRON HCL 4 MG/2ML IJ SOLN
4.0000 mg | Freq: Four times a day (QID) | INTRAMUSCULAR | Status: DC | PRN
  Administered 2014-11-20: 4 mg via INTRAVENOUS
  Filled 2014-11-20: qty 2

## 2014-11-20 MED ORDER — ACETAMINOPHEN 325 MG PO TABS: 650.0000 mg | ORAL_TABLET | Freq: Four times a day (QID) | ORAL | Status: DC | PRN

## 2014-11-20 MED ORDER — ALBUTEROL SULFATE (2.5 MG/3ML) 0.083% IN NEBU: 2.5000 mg | INHALATION_SOLUTION | RESPIRATORY_TRACT | Status: DC | PRN

## 2014-11-20 MED ORDER — SODIUM CHLORIDE 0.9 % IJ SOLN: 3.0000 mL | Freq: Two times a day (BID) | INTRAMUSCULAR | Status: DC

## 2014-11-20 MED ORDER — OXYCODONE-ACETAMINOPHEN 10-325 MG PO TABS: 1.0000 | ORAL_TABLET | Freq: Four times a day (QID) | ORAL | Status: DC | PRN

## 2014-11-20 MED ORDER — SODIUM CHLORIDE 0.9 % IV SOLN
INTRAVENOUS | Status: AC
  Filled 2014-11-20 (×2): qty 1000

## 2014-11-20 MED ORDER — TRIAMTERENE-HCTZ 37.5-25 MG PO CAPS
1.0000 | ORAL_CAPSULE | Freq: Every day | ORAL | Status: DC
  Filled 2014-11-20: qty 1

## 2014-11-20 MED ORDER — MAGNESIUM SULFATE 2 GM/50ML IV SOLN
2.0000 g | Freq: Once | INTRAVENOUS | Status: AC
  Administered 2014-11-20: 2 g via INTRAVENOUS
  Filled 2014-11-20: qty 50

## 2014-11-20 MED ORDER — POTASSIUM CHLORIDE CRYS ER 20 MEQ PO TBCR
40.0000 meq | EXTENDED_RELEASE_TABLET | Freq: Two times a day (BID) | ORAL | Status: DC
  Administered 2014-11-20: 40 meq via ORAL
  Filled 2014-11-20 (×2): qty 2

## 2014-11-20 MED ORDER — OXYCODONE HCL 5 MG PO TABS: 5.0000 mg | ORAL_TABLET | Freq: Four times a day (QID) | ORAL | Status: DC | PRN

## 2014-11-20 NOTE — ED Notes (Signed)
Pt cannot tolerate to drink contrast for CT.  Informed Tatyanna, PA who will defer CT to admitting MD.

## 2014-11-20 NOTE — Progress Notes (Addendum)
CRITICAL VALUE ALERT  Critical value received:  K+2.1  Date of notification:  11/20/14   Time of notification:  0640  Critical value read back:Yes.    Nurse who received alert:  Claiborne Billings Capes/ Philemon Kingdom   MD notified (1st page):  Renea Ee NP  Time of first page:  0645  MD notified (2nd page):  Time of second page:  Responding MD:  Renea Ee NP   Time MD responded: [phone]  Will continue to monitor patient, and add two more runs of K

## 2014-11-20 NOTE — Progress Notes (Signed)
INITIAL NUTRITION ASSESSMENT  DOCUMENTATION CODES Per approved criteria  -Severe malnutrition in the context of acute on chronic illness  Pt meets criteria for severe MALNUTRITION in the context of acute on chronic illness as evidenced by PO intake < 50% for two weeks, 10% body weight loss in < 3 months.   INTERVENTION: -Reviewed nutrition therapy to assist with taste alteration; pt in agreement to trial suggestions during admit and upon d'c -Pt declined nutrition supplements; family has been providing pt outside food sources for snack option -Encouraged PO intake of protein foods for weight and lean muscle maintenance -Continue with anti-emetics -RD to continue to monitor   NUTRITION DIAGNOSIS: Inadequate oral intake related to taste alterations/n/v as evidenced by PO intake < 75% for two weeks, 20 lb weight loss in 2 months.   Goal: Pt to meet >/= 90% of their estimated nutrition needs    Monitor:  Total protein/energy intake, labs, weights, education needs  Reason for Assessment: MST  58 y.o. female  Admitting Dx: Hypokalemia  ASSESSMENT: Jacqueline Moyer is a 58 y.o. female with a Past Medical History of colon cancer status post colectomy in 2006, hypothyroidism, hypertension who presents today with the above noted complaint. Per patient, for the past 2 weeks she has just not been feeling good, she claims that she is developed right mid quadrant abdominal pain along with nausea, intermittent vomiting  -Pt reported ongoing decreased intake for past 2 weeks d/t n/v and abd pain. Was unable to tolerate any form of liquids or solids. Endorsed taste alterations that also contributed to decreased intake, such as foods tasting "too sweet "or tasting "different than normal" -Experienced 20 lb unintentional wt loss, unclear to whether it was over 2-3 months as noted in H&P as pt reported wt loss occurred over 2 weeks. Dehydration likely contributing; however wt loss of 20 lb still  significant in 2-3 month time frame (10% body weight loss, severe) -Nausea currently controlled with medications -Pt noted some improvement in appetite today. Family bringing in foods from outside sources; able to tolerate small amount (<25%) of salads, scallops, ribs, and crackers -Continues with taste changes; reviewed use of mouth rinses, lemon drops mints to cleanse palate. Encouraged use of vinegar, pickles or small amount of salt to avoid excess sweetness. Pt verbalized understanding -Pt declined additional snacks or supplements at this time  -Low K; being repleted, likely r/t to loose stools; C.diff results pending  Height: Ht Readings from Last 1 Encounters:  11/19/14 5' 5.5" (1.664 m)    Weight: Wt Readings from Last 1 Encounters:  11/19/14 170 lb (77.111 kg)    Ideal Body Weight: 125 lb  % Ideal Body Weight: 136%  Wt Readings from Last 10 Encounters:  11/19/14 170 lb (77.111 kg)  08/08/14 227 lb (102.967 kg)  07/25/14 227 lb 9.6 oz (103.239 kg)  06/28/14 236 lb (107.049 kg)  06/14/14 236 lb 12.8 oz (107.412 kg)  04/11/14 245 lb (111.131 kg)  04/04/14 250 lb (113.399 kg)  01/18/13 247 lb 5.7 oz (112.2 kg)  08/29/08 211 lb 6.1 oz (95.882 kg)    Usual Body Weight: 190  Lb (2-3 months ago vs 2 weeks ago)  % Usual Body Weight: 90%  BMI:  Body mass index is 27.85 kg/(m^2).  Estimated Nutritional Needs: Kcal: 1650-1850 Protein: 80-95 gram Fluid: >/=1900 ml daily  Skin: +3 RLE and LLE edemas  Diet Order: Diet regular  EDUCATION NEEDS: -Education needs addressed  No intake or output data in the  24 hours ending 11/20/14 1654  Last BM: 12/27   Labs:   Recent Labs Lab 11/19/14 2113 11/19/14 2252 11/20/14 0442  NA 131*  --  130*  K 2.2*  --  2.1*  CL 79*  --  85*  CO2 39*  --  36*  BUN 25*  --  23  CREATININE 1.09  --  0.85  CALCIUM 8.7  --  7.5*  MG  --  1.8 1.7  GLUCOSE 108*  --  75    CBG (last 3)  No results for input(s): GLUCAP in the last  72 hours.  Scheduled Meds: . cefTRIAXone (ROCEPHIN)  IV  1 g Intravenous Q24H  . enoxaparin (LOVENOX) injection  40 mg Subcutaneous Q24H  . levothyroxine  125 mcg Oral QAC breakfast  . pantoprazole  40 mg Oral Daily  . potassium chloride  60 mEq Oral QID  . sertraline  100 mg Oral QODAY  . sodium chloride  3 mL Intravenous Q12H    Continuous Infusions: . 0.9 % sodium chloride with kcl      Past Medical History  Diagnosis Date  . Hypertension   . Thyroid disease     hyperthyroidism  . Chronic back pain   . Chronic neck pain   . Depression   . COPD (chronic obstructive pulmonary disease)   . Anemia   . Cancer approx 2006    colon cancer  . Blood transfusion without reported diagnosis     Past Surgical History  Procedure Laterality Date  . Cholecystectomy    . Tonsillectomy    . Abdominal hysterectomy    . Hernia repair    . Colon surgery  approx. 2006    for colon cancer  . Gastric bypass  [place] LDN Clinical Dietitian [phone]

## 2014-11-20 NOTE — Progress Notes (Signed)
Pt reported diarrhrea 2x, sent a stool for C-diff & placed on isolation per  Infection protocol.

## 2014-11-20 NOTE — Progress Notes (Addendum)
[facility] PROGRESS NOTE  Jacqueline Moyer AYT:[id number] DOB: 03/19/1956 DOA: 11/19/2014 PCP: Gavin Pound, MD  Assessment/Plan: Principal Problem:   Hypokalemia Active Problems:   GERD   History of malignant neoplasm of large intestine   Benign essential HTN   UTI (lower urinary tract infection)   Abdominal pain      Hypokalemia/hyponatremia: Multifactorial-secondary to nausea/vomiting and use of Lasix.  Replete aggressively and recheck Magnesium 1.7 Discontinue Dyazide, Lasix  Generalized edema BNP 141 2-D echo pending Continue to hold diuretics   . Abdominal pain along with nausea/vomiting: Etiology not clear, given a history of malignancy a CT scan of the abdomen and pelvis completed and negative,   Lipase levels are normal.  Start the patient on a diet Symptoms could be secondary to UTI, continue Rocephin treatment.  Per patient, she is status post cholecystectomy and appendectomy  .? UTI (lower urinary tract infection):  Continue Rocephin, follow urine culture  . GERD: Continue with PPI   . History of malignant neoplasm of large intestine: She recently had a colonoscopy in September of this year which showed a polyp-biopsy showed tubular adenoma . Await CT scan of the abdomen.   . Benign essential HTN: Continue with Maxzide, holding his Lasix.   Marland Kitchen Hypothyroidism: Continue levothyroxine.  . Bilateral lower extremity edema: UA negative for proteinuria, LFTs essentially normal-check echocardiogram. Continue Maxide, hold Lasix given severe hypokalemia.    Code Status: full Family Communication: family updated about patient's clinical progress Disposition Plan:  As above    Brief narrative: 58 y.o. female with a Past Medical History of colon cancer status post colectomy in 2006, hypothyroidism, hypertension who presents today with the above noted complaint. Per patient, for the past 2 weeks she has just not been feeling good, she claims that she is  developed right mid quadrant abdominal pain along with nausea, intermittent vomiting. She claims that because of persistent vomiting and abdominal pain she is no longer able to tolerate any diet, and has been mostly on clear liquids over the past few days. She claims that she has lost approximately 20 pounds in the past few months which is unintentional. Because of persistent abdominal pain, nausea and vomiting she presented to the emergency room, where she was found to have significant hypokalemia, I was asked to admit this patient for further evaluation and treatment. Per patient, approximately 2 weeks back she saw her primary care doctor who prescribed her Lasix in addition to Maxzide for worsening lower extremity edema. Per patient, for the past few days she has had some generalized weakness as well. Denies any fever, but does claim to have had intermittent chills over the past few days. During my evaluation patient denied any chest pain, however apparently she did have some right-sided chest discomfort early in the morning that has since resolved and has not reoccurred since then.   Consultants: None Procedures:  None  Anti-infectives: None  HPI/Subjective: Patient awake alert oriented, eating breakfast denies any nausea vomiting abdominal pain  Objective: Filed Vitals:   11/19/14 2136 11/19/14 2349 11/20/14 0034 11/20/14 0447  BP: 134/78 121/64 111/85 113/58  Pulse:  86 73 72  Temp:   98.4 F (36.9 C) 98.3 F (36.8 C)  TempSrc:   Oral Oral  Resp: 23 18 20 18   Height:      Weight:      SpO2:  100% 100% 97%   No intake or output data in the 24 hours ending 11/20/14 1150  Exam:  General: alert &  oriented x 3 In NAD  Cardiovascular: RRR, nl S1 s2  Respiratory: Decreased breath sounds at the bases, scattered rhonchi, no crackles  Abdomen: soft +BS NT/ND, no masses palpable  Extremities: No cyanosis and no edema      Data Reviewed: Basic Metabolic Panel:  Recent  Labs Lab 11/19/14 2113 11/19/14 2252 11/20/14 0442  NA 131*  --  130*  K 2.2*  --  2.1*  CL 79*  --  85*  CO2 39*  --  36*  GLUCOSE 108*  --  75  BUN 25*  --  23  CREATININE 1.09  --  0.85  CALCIUM 8.7  --  7.5*  MG  --  1.8 1.7    Liver Function Tests:  Recent Labs Lab 11/19/14 2113  AST 19  ALT 13  ALKPHOS 110  BILITOT 1.0  PROT 6.7  ALBUMIN 3.5    Recent Labs Lab 11/19/14 2113  LIPASE 24   No results for input(s): AMMONIA in the last 168 hours.  CBC:  Recent Labs Lab 11/19/14 2113 11/20/14 0442  WBC 12.7* 10.8*  NEUTROABS 9.6*  --   HGB 13.8 11.6*  HCT 44.6 37.9  MCV 78.8 78.6  PLT 494* 393    Cardiac Enzymes: No results for input(s): CKTOTAL, CKMB, CKMBINDEX, TROPONINI in the last 168 hours. BNP (last 3 results) No results for input(s): PROBNP in the last 8760 hours.   CBG: No results for input(s): GLUCAP in the last 168 hours.  No results found for this or any previous visit (from the past 240 hour(s)).   Studies: Ct Abdomen Pelvis W Contrast  11/20/2014   CLINICAL DATA:  Nausea, vomiting, constipation.  EXAM: CT ABDOMEN AND PELVIS WITH CONTRAST  TECHNIQUE: Multidetector CT imaging of the abdomen and pelvis was performed using the standard protocol following bolus administration of intravenous contrast.  CONTRAST:  139mL OMNIPAQUE IOHEXOL 300 MG/ML  SOLN  COMPARISON:  01/03/2009  FINDINGS: BODY WALL: Fat and vessels present within a small epigastric abdominal wall hernia. Patient is status post extensive mesh repair of a more inferior abdominal wall hernia.  LOWER CHEST: Coronary atherosclerosis.  ABDOMEN/PELVIS:  Liver: No focal abnormality.  Biliary: Mild intra and extrahepatic biliary dilatation post cholecystectomy. Given the liver function tests, this is likely reservoir effect.  Pancreas: Unremarkable.  Spleen: Unremarkable.  Adrenals: Unremarkable.  Kidneys and ureters: 8 mm stone in the interpolar left kidney which is chronic. There is  progressive cystic formation around the stone which favors caliceal diverticulum (although there is no confirmatory filling on delayed imaging).  Bladder: Unremarkable given decompressed state  Reproductive: Hysterectomy and probable oophorectomies.  Bowel: Status post gastric bypass and transverse descending colon anastomosis. There is no bowel obstruction or definite inflammatory wall thickening. Probable appendectomy.  Retroperitoneum: No mass or adenopathy.  Peritoneum: No ascites or pneumoperitoneum.  Vascular: No acute abnormality.  OSSEOUS: No acute abnormalities. Lower lumbar facet arthropathy with mild L4-5 slip.  IMPRESSION: 1. No acute intra-abdominal findings. 2. Chronic/postsurgical changes are described above.   Electronically Signed   By: Jorje Guild M.D.   On: 11/20/2014 02:29   Dg Abd Acute W/chest  11/19/2014   CLINICAL DATA:  Chest and abdominal pain for 2 weeks. Nausea, vomiting.  EXAM: ACUTE ABDOMEN SERIES (ABDOMEN 2 VIEW & CHEST 1 VIEW)  COMPARISON:  CT abdomen and pelvis 01/03/2009.  FINDINGS: Prior cholecystectomy. Nonobstructive bowel gas pattern. No free air organomegaly. 8 mm calcification projects over the midpole of the left kidney compatible  with left renal stone as seen on prior abdominal CT.  Heart and mediastinal contours are within normal limits. No focal opacities or effusions. No acute bony abnormality.  IMPRESSION: Left nephrolithiasis, stable since 2010.  No acute findings.   Electronically Signed   By: Rolm Baptise M.D.   On: 11/19/2014 22:25    Scheduled Meds: . cefTRIAXone (ROCEPHIN)  IV  1 g Intravenous Q24H  . enoxaparin (LOVENOX) injection  40 mg Subcutaneous Q24H  . levothyroxine  125 mcg Oral QAC breakfast  . pantoprazole  40 mg Oral Daily  . potassium chloride  10 mEq Intravenous Q1 Hr x 2  . potassium chloride  60 mEq Oral QID  . sertraline  100 mg Oral QODAY  . sodium chloride  3 mL Intravenous Q12H   Continuous Infusions: . 0.9 % sodium chloride  with kcl 100 mL/hr at 11/20/14 1142    Principal Problem:   Hypokalemia Active Problems:   GERD   History of malignant neoplasm of large intestine   Benign essential HTN   UTI (lower urinary tract infection)   Abdominal pain    Time spent: 40 minutes   [facility] Pager [phone]. If 7PM-7AM, please contact night-coverage at [email or web address], password [facility] 11/20/2014, 11:50 AM  LOS: 1 day

## 2014-11-20 NOTE — Progress Notes (Signed)
  Echocardiogram 2D Echocardiogram has been performed.  Jacqueline Moyer 11/20/2014, 10:54 AM

## 2014-11-21 DIAGNOSIS — E43 Unspecified severe protein-calorie malnutrition: Secondary | ICD-10-CM | POA: Insufficient documentation

## 2014-11-21 LAB — COMPREHENSIVE METABOLIC PANEL
ALT: 12 U/L (ref 0–35)
AST: 16 U/L (ref 0–37)
Albumin: 3.1 g/dL — ABNORMAL LOW (ref 3.5–5.2)
Alkaline Phosphatase: 91 U/L (ref 39–117)
Anion gap: 6 (ref 5–15)
BUN: 17 mg/dL (ref 6–23)
CO2: 31 mmol/L (ref 19–32)
Calcium: 7.9 mg/dL — ABNORMAL LOW (ref 8.4–10.5)
Chloride: 95 mEq/L — ABNORMAL LOW (ref 96–112)
Creatinine, Ser: 0.78 mg/dL (ref 0.50–1.10)
GFR calc Af Amer: >90 mL/min (ref >90)
GLUCOSE: 110 mg/dL — AB (ref 70–99)
POTASSIUM: 5.1 mmol/L (ref 3.5–5.1)
Sodium: 132 mmol/L — ABNORMAL LOW (ref 135–145)
TOTAL PROTEIN: 5.8 g/dL — AB (ref 6.0–8.3)
Total Bilirubin: 0.3 mg/dL (ref 0.3–1.2)

## 2014-11-21 MED ORDER — CIPROFLOXACIN HCL 500 MG PO TABS: 500.0000 mg | ORAL_TABLET | Freq: Two times a day (BID) | ORAL | Status: AC

## 2014-11-21 MED ORDER — POTASSIUM CHLORIDE CRYS ER 20 MEQ PO TBCR: 60.0000 meq | EXTENDED_RELEASE_TABLET | Freq: Every day | ORAL | Status: DC

## 2014-11-21 NOTE — Discharge Summary (Addendum)
Physician Discharge Summary  Bettyanne Dittman MRN: [id number] DOB/AGE: [birth year] 58 y.o.  PCP: Gavin Pound, MD   Admit date: 11/19/2014 Discharge date: 11/21/2014  Discharge Diagnoses:  UTI   Hypokalemia Active Problems:   GERD   History of malignant neoplasm of large intestine   Benign essential HTN   UTI (lower urinary tract infection)   Abdominal pain   Protein-calorie malnutrition, severe   Follow-up recommendations Follow-up with PCP in 5-7 days BMP, magnesium, CBC in 2-3 days PCP to adjust dose of levothyroxine, repeat TSH and free T4 in one week     Medication List    STOP taking these medications        triamterene-hydrochlorothiazide 37.5-25 MG per capsule  Commonly known as:  DYAZIDE      TAKE these medications        ciprofloxacin 500 MG tablet  Commonly known as:  CIPRO  Take 1 tablet (500 mg total) by mouth 2 (two) times daily.     furosemide 40 MG tablet  Commonly known as:  LASIX  Take 1 tablet by mouth daily. For 4 days     levothyroxine 125 MCG tablet  Commonly known as:  SYNTHROID, LEVOTHROID  Take 125 mcg by mouth daily.     omeprazole 20 MG capsule  Commonly known as:  PRILOSEC  Take 20 mg by mouth daily.     ondansetron 4 MG tablet  Commonly known as:  ZOFRAN  Take 1 tablet (4 mg total) by mouth every 8 (eight) hours as needed for nausea or vomiting (1 pill by mouth as needed for nausea/vomiting x1, then repeat x1 the day before the procedure and also the day of the procedure with prep x1).     oxyCODONE-acetaminophen 10-325 MG per tablet  Commonly known as:  PERCOCET  Take 1 tablet by mouth every 4 (four) hours as needed for pain.     potassium chloride SA 20 MEQ tablet  Commonly known as:  K-DUR,KLOR-CON  Take 3 tablets (60 mEq total) by mouth daily.     sertraline 100 MG tablet  Commonly known as:  ZOLOFT  Take 100 mg by mouth every other day.        Discharge Condition: Stable Disposition: 01-Home or Self  Care   Consults: [facility] Significant Diagnostic Studies: Ct Abdomen Pelvis W Contrast  11/20/2014   CLINICAL DATA:  Nausea, vomiting, constipation.  EXAM: CT ABDOMEN AND PELVIS WITH CONTRAST  TECHNIQUE: Multidetector CT imaging of the abdomen and pelvis was performed using the standard protocol following bolus administration of intravenous contrast.  CONTRAST:  152m OMNIPAQUE IOHEXOL 300 MG/ML  SOLN  COMPARISON:  01/03/2009  FINDINGS: BODY WALL: Fat and vessels present within a small epigastric abdominal wall hernia. Patient is status post extensive mesh repair of a more inferior abdominal wall hernia.  LOWER CHEST: Coronary atherosclerosis.  ABDOMEN/PELVIS:  Liver: No focal abnormality.  Biliary: Mild intra and extrahepatic biliary dilatation post cholecystectomy. Given the liver function tests, this is likely reservoir effect.  Pancreas: Unremarkable.  Spleen: Unremarkable.  Adrenals: Unremarkable.  Kidneys and ureters: 8 mm stone in the interpolar left kidney which is chronic. There is progressive cystic formation around the stone which favors caliceal diverticulum (although there is no confirmatory filling on delayed imaging).  Bladder: Unremarkable given decompressed state  Reproductive: Hysterectomy and probable oophorectomies.  Bowel: Status post gastric bypass and transverse descending colon anastomosis. There is no bowel obstruction or definite inflammatory wall thickening. Probable appendectomy.  Retroperitoneum: No  mass or adenopathy.  Peritoneum: No ascites or pneumoperitoneum.  Vascular: No acute abnormality.  OSSEOUS: No acute abnormalities. Lower lumbar facet arthropathy with mild L4-5 slip.  IMPRESSION: 1. No acute intra-abdominal findings. 2. Chronic/postsurgical changes are described above.   Electronically Signed   By: Jorje Guild M.D.   On: 11/20/2014 02:29   Dg Abd Acute W/chest  11/19/2014   CLINICAL DATA:  Chest and abdominal pain for 2 weeks. Nausea, vomiting.  EXAM: ACUTE  ABDOMEN SERIES (ABDOMEN 2 VIEW & CHEST 1 VIEW)  COMPARISON:  CT abdomen and pelvis 01/03/2009.  FINDINGS: Prior cholecystectomy. Nonobstructive bowel gas pattern. No free air organomegaly. 8 mm calcification projects over the midpole of the left kidney compatible with left renal stone as seen on prior abdominal CT.  Heart and mediastinal contours are within normal limits. No focal opacities or effusions. No acute bony abnormality.  IMPRESSION: Left nephrolithiasis, stable since 2010.  No acute findings.   Electronically Signed   By: Rolm Baptise M.D.   On: 11/19/2014 22:25     Microbiology: Recent Results (from the past 240 hour(s))  Blood culture (routine x 2)     Status: [facility] (Preliminary result)   Collection Time: 11/19/14 11:29 PM  Result Value Ref Range Status   Specimen Description BLOOD RIGHT ARM  Final   Special Requests BOTTLES DRAWN AEROBIC AND ANAEROBIC 5CC  Final   Culture   Final           BLOOD CULTURE RECEIVED NO GROWTH TO DATE CULTURE WILL BE HELD FOR 5 DAYS BEFORE ISSUING A FINAL NEGATIVE REPORT Performed at [facility]    Report Status PENDING  Incomplete  Blood culture (routine x 2)     Status: [facility] (Preliminary result)   Collection Time: 11/19/14 11:43 PM  Result Value Ref Range Status   Specimen Description BLOOD LEFT HAND  Final   Special Requests BOTTLES DRAWN AEROBIC ONLY 3CC  Final   Culture   Final           BLOOD CULTURE RECEIVED NO GROWTH TO DATE CULTURE WILL BE HELD FOR 5 DAYS BEFORE ISSUING A FINAL NEGATIVE REPORT Performed at [facility]    Report Status PENDING  Incomplete  Clostridium Difficile by PCR     Status: [facility]   Collection Time: 11/20/14  1:37 PM  Result Value Ref Range Status   C difficile by pcr NEGATIVE NEGATIVE Final    Comment: Performed at [facility]: Results for orders placed or performed during the hospital encounter of 11/19/14 (from the past 48 hour(s))  CBC with Differential     Status: Abnormal    Collection Time: 11/19/14  9:13 PM  Result Value Ref Range   WBC 12.7 (H) 4.0 - 10.5 K/uL   RBC 5.66 (H) 3.87 - 5.11 MIL/uL   Hemoglobin 13.8 12.0 - 15.0 g/dL   HCT 44.6 36.0 - 46.0 %   MCV 78.8 78.0 - 100.0 fL   MCH 24.4 (L) 26.0 - 34.0 pg   MCHC 30.9 30.0 - 36.0 g/dL   RDW 19.5 (H) 11.5 - 15.5 %   Platelets 494 (H) 150 - 400 K/uL   Neutrophils Relative % 75 43 - 77 %   Neutro Abs 9.6 (H) 1.7 - 7.7 K/uL   Lymphocytes Relative 16 12 - 46 %   Lymphs Abs 2.0 0.7 - 4.0 K/uL   Monocytes Relative 9 3 - 12 %   Monocytes Absolute  1.1 (H) 0.1 - 1.0 K/uL   Eosinophils Relative 0 0 - 5 %   Eosinophils Absolute 0.0 0.0 - 0.7 K/uL   Basophils Relative 0 0 - 1 %   Basophils Absolute 0.0 0.0 - 0.1 K/uL  Comprehensive metabolic panel     Status: Abnormal   Collection Time: 11/19/14  9:13 PM  Result Value Ref Range   Sodium 131 (L) 135 - 145 mmol/L    Comment: Please note change in reference range.   Potassium 2.2 (LL) 3.5 - 5.1 mmol/L    Comment: Please note change in reference range. DELTA CHECK NOTED CRITICAL RESULT CALLED TO, READ BACK BY AND VERIFIED WITH: SPOKE WITH ELTON,L RN [phone] COV[place],N    Chloride 79 (L) 96 - 112 mEq/L   CO2 39 (H) 19 - 32 mmol/L   Glucose, Bld 108 (H) 70 - 99 mg/dL   BUN 25 (H) 6 - 23 mg/dL   Creatinine, Ser 1.09 0.50 - 1.10 mg/dL   Calcium 8.7 8.4 - 10.5 mg/dL   Total Protein 6.7 6.0 - 8.3 g/dL   Albumin 3.5 3.5 - 5.2 g/dL   AST 19 0 - 37 U/L   ALT 13 0 - 35 U/L   Alkaline Phosphatase 110 39 - 117 U/L   Total Bilirubin 1.0 0.3 - 1.2 mg/dL   GFR calc non Af Amer 55 (L) >90 mL/min   GFR calc Af Amer 64 (L) >90 mL/min    Comment: (NOTE) The eGFR has been calculated using the CKD EPI equation. This calculation has not been validated in all clinical situations. eGFR's persistently <90 mL/min signify possible Chronic Kidney Disease.    Anion gap 13 5 - 15  Lipase, blood     Status: [facility]   Collection Time: 11/19/14  9:13 PM  Result Value Ref  Range   Lipase 24 11 - 59 U/L  I-stat troponin, ED (only if pt is 57 y.o. or older & pain is above umbilicus) - do not order at [facility]     Status: [facility]   Collection Time: 11/19/14  9:28 PM  Result Value Ref Range   Troponin i, poc 0.02 0.00 - 0.08 ng/mL   Comment 3            Comment: Due to the release kinetics of cTnI, a negative result within the first hours of the onset of symptoms does not rule out myocardial infarction with certainty. If myocardial infarction is still suspected, repeat the test at appropriate intervals.   Urinalysis, Routine w reflex microscopic     Status: Abnormal   Collection Time: 11/19/14  9:38 PM  Result Value Ref Range   Color, Urine AMBER (A) YELLOW    Comment: BIOCHEMICALS MAY BE AFFECTED BY COLOR   APPearance CLOUDY (A) CLEAR   Specific Gravity, Urine 1.021 1.005 - 1.030   pH 6.5 5.0 - 8.0   Glucose, UA NEGATIVE NEGATIVE mg/dL   Hgb urine dipstick NEGATIVE NEGATIVE   Bilirubin Urine MODERATE (A) NEGATIVE   Ketones, ur 15 (A) NEGATIVE mg/dL   Protein, ur 30 (A) NEGATIVE mg/dL   Urobilinogen, UA 1.0 0.0 - 1.0 mg/dL   Nitrite POSITIVE (A) NEGATIVE   Leukocytes, UA MODERATE (A) NEGATIVE  Urine microscopic-add on     Status: Abnormal   Collection Time: 11/19/14  9:38 PM  Result Value Ref Range   Squamous Epithelial / LPF MANY (A) RARE   WBC, UA 21-50 <3 WBC/hpf   Bacteria, UA MANY (A)  RARE  Pregnancy, urine     Status: [facility]   Collection Time: 11/19/14  9:38 PM  Result Value Ref Range   Preg Test, Ur NEGATIVE NEGATIVE    Comment:        THE SENSITIVITY OF THIS METHODOLOGY IS >20 mIU/mL.   Brain natriuretic peptide     Status: Abnormal   Collection Time: 11/19/14 10:46 PM  Result Value Ref Range   B Natriuretic Peptide 141.8 (H) 0.0 - 100.0 pg/mL    Comment: Please note change in reference range.  Magnesium     Status: [facility]   Collection Time: 11/19/14 10:52 PM  Result Value Ref Range   Magnesium 1.8 1.5 - 2.5 mg/dL  I-Stat CG4 Lactic Acid,  ED     Status: [facility]   Collection Time: 11/19/14 11:02 PM  Result Value Ref Range   Lactic Acid, Venous 1.21 0.5 - 2.2 mmol/L  Blood culture (routine x 2)     Status: [facility] (Preliminary result)   Collection Time: 11/19/14 11:29 PM  Result Value Ref Range   Specimen Description BLOOD RIGHT ARM    Special Requests BOTTLES DRAWN AEROBIC AND ANAEROBIC 5CC    Culture             BLOOD CULTURE RECEIVED NO GROWTH TO DATE CULTURE WILL BE HELD FOR 5 DAYS BEFORE ISSUING A FINAL NEGATIVE REPORT Performed at [facility]    Report Status PENDING   Blood culture (routine x 2)     Status: [facility] (Preliminary result)   Collection Time: 11/19/14 11:43 PM  Result Value Ref Range   Specimen Description BLOOD LEFT HAND    Special Requests BOTTLES DRAWN AEROBIC ONLY 3CC    Culture             BLOOD CULTURE RECEIVED NO GROWTH TO DATE CULTURE WILL BE HELD FOR 5 DAYS BEFORE ISSUING A FINAL NEGATIVE REPORT Performed at [facility]    Report Status PENDING   Magnesium     Status: [facility]   Collection Time: 11/20/14  4:42 AM  Result Value Ref Range   Magnesium 1.7 1.5 - 2.5 mg/dL  Basic metabolic panel     Status: Abnormal   Collection Time: 11/20/14  4:42 AM  Result Value Ref Range   Sodium 130 (L) 135 - 145 mmol/L    Comment: Please note change in reference range.   Potassium 2.1 (LL) 3.5 - 5.1 mmol/L    Comment: Please note change in reference range. CRITICAL RESULT CALLED TO, READ BACK BY AND VERIFIED WITH: CAPES RN AT [phone] ON 12.28.15 BY SHUEA    Chloride 85 (L) 96 - 112 mEq/L   CO2 36 (H) 19 - 32 mmol/L   Glucose, Bld 75 70 - 99 mg/dL   BUN 23 6 - 23 mg/dL   Creatinine, Ser 0.85 0.50 - 1.10 mg/dL   Calcium 7.5 (L) 8.4 - 10.5 mg/dL   GFR calc non Af Amer 75 (L) >90 mL/min   GFR calc Af Amer 86 (L) >90 mL/min    Comment: (NOTE) The eGFR has been calculated using the CKD EPI equation. This calculation has not been validated in all clinical situations. eGFR's persistently <90  mL/min signify possible Chronic Kidney Disease.    Anion gap 9 5 - 15  CBC     Status: Abnormal   Collection Time: 11/20/14  4:42 AM  Result Value Ref Range   WBC 10.8 (H) 4.0 - 10.5 K/uL   RBC 4.82  3.87 - 5.11 MIL/uL   Hemoglobin 11.6 (L) 12.0 - 15.0 g/dL   HCT 37.9 36.0 - 46.0 %   MCV 78.6 78.0 - 100.0 fL   MCH 24.1 (L) 26.0 - 34.0 pg   MCHC 30.6 30.0 - 36.0 g/dL   RDW 19.6 (H) 11.5 - 15.5 %   Platelets 393 150 - 400 K/uL  TSH     Status: Abnormal   Collection Time: 11/20/14  9:43 AM  Result Value Ref Range   TSH 0.082 (L) 0.350 - 4.500 uIU/mL    Comment: Performed at [facility]  Clostridium Difficile by PCR     Status: [facility]   Collection Time: 11/20/14  1:37 PM  Result Value Ref Range   C difficile by pcr NEGATIVE NEGATIVE    Comment: Performed at [facility] metabolic panel     Status: Abnormal   Collection Time: 11/20/14  5:24 PM  Result Value Ref Range   Sodium 131 (L) 135 - 145 mmol/L    Comment: Please note change in reference range.   Potassium 3.2 (L) 3.5 - 5.1 mmol/L    Comment: Please note change in reference range. RESULT REPEATED AND VERIFIED DELTA CHECK NOTED NO VISIBLE HEMOLYSIS    Chloride 88 (L) 96 - 112 mEq/L   CO2 34 (H) 19 - 32 mmol/L   Glucose, Bld 112 (H) 70 - 99 mg/dL   BUN 18 6 - 23 mg/dL   Creatinine, Ser 0.85 0.50 - 1.10 mg/dL   Calcium 7.8 (L) 8.4 - 10.5 mg/dL   GFR calc non Af Amer 75 (L) >90 mL/min   GFR calc Af Amer 86 (L) >90 mL/min    Comment: (NOTE) The eGFR has been calculated using the CKD EPI equation. This calculation has not been validated in all clinical situations. eGFR's persistently <90 mL/min signify possible Chronic Kidney Disease.    Anion gap 9 5 - 15  Comprehensive metabolic panel     Status: Abnormal   Collection Time: 11/21/14  4:49 AM  Result Value Ref Range   Sodium 132 (L) 135 - 145 mmol/L    Comment: Please note change in reference range.   Potassium 5.1 3.5 - 5.1 mmol/L    Comment:  Please note change in reference range. DELTA CHECK NOTED REPEATED TO VERIFY NO VISIBLE HEMOLYSIS    Chloride 95 (L) 96 - 112 mEq/L   CO2 31 19 - 32 mmol/L   Glucose, Bld 110 (H) 70 - 99 mg/dL   BUN 17 6 - 23 mg/dL   Creatinine, Ser 0.78 0.50 - 1.10 mg/dL   Calcium 7.9 (L) 8.4 - 10.5 mg/dL   Total Protein 5.8 (L) 6.0 - 8.3 g/dL   Albumin 3.1 (L) 3.5 - 5.2 g/dL   AST 16 0 - 37 U/L   ALT 12 0 - 35 U/L   Alkaline Phosphatase 91 39 - 117 U/L   Total Bilirubin 0.3 0.3 - 1.2 mg/dL   GFR calc non Af Amer >90 >90 mL/min   GFR calc Af Amer >90 >90 mL/min    Comment: (NOTE) The eGFR has been calculated using the CKD EPI equation. This calculation has not been validated in all clinical situations. eGFR's persistently <90 mL/min signify possible Chronic Kidney Disease.    Anion gap 6 5 - 15     Jacqueline :*  58 y.o. female with a Past Medical History of colon cancer status post colectomy in [place], Jacqueline [place], Jacqueline who presents today  with the above noted complaint. Per patient, for the past 2 weeks she has just not been feeling good, she claims that she is developed right mid quadrant abdominal pain along with nausea, intermittent vomiting. She claims that because of persistent vomiting and abdominal pain she is no longer able to tolerate any diet, and has been mostly on clear liquids over the past few days. She claims that she has lost approximately 20 pounds in the past few months which is unintentional. Because of persistent abdominal pain, nausea and vomiting she presented to the emergency room, where she was found to have significant hypokalemia, I was asked to admit this patient for further evaluation and treatment. Per patient, approximately 2 weeks back she saw her primary care doctor who prescribed her Lasix in addition to Maxzide for worsening lower extremity edema. Per patient, for the past few days she has had some generalized weakness as well. Denies any fever, but does claim to  have had intermittent chills over the past few days. During my evaluation patient denied any chest pain, however apparently she did have some right-sided chest discomfort early in the morning that has since resolved and has not reoccurred since then.     HOSPITAL COURSE:  Hypokalemia/hyponatremia: Multifactorial-secondary to nausea/vomiting and use of Lasix, dyazide Replete aggressively and recheck Magnesium 1.7 Discontinue Dyazide, cont Lasix Recheck BMP, magnesium and 2-3 days    Generalized edema BNP 141 2-D echo EF: 60% -  65% Restart lasix    . Abdominal pain along with nausea/vomiting: Etiology not clear, given a history of malignancy a CT scan of the abdomen and pelvis completed and negative,  Lipase levels are normal.  Patient tolerated regular diet Symptoms could be secondary to UTI, started on Rocephin , switched to ciprofloxacin    .? UTI (lower urinary tract infection):  Treated with Rocephin, urine culture pending, switched to ciprofloxacin for 3 more days   . GERD: Continue with PPI   . History of malignant neoplasm of large intestine: She recently had a colonoscopy in September of this year which showed a polyp-biopsy showed tubular adenoma . Await CT scan of the abdomen.   . Benign essential HTN: Continue with Maxzide, holding his Lasix.   Marland Kitchen Jacqueline Moyer: Continue levothyroxine. TSH is suppressed, requesting PCP to adjust levothyroxine dose   . Bilateral lower extremity edema: UA negative for proteinuria, LFTs essentially . Echocardiogram shows grade 2 diastolic dysfunction.Marland Kitchen Resume Lasix .    Discharge Exam:  Blood pressure 144/85, pulse 68, temperature 97.7 F (36.5 C), temperature source Axillary, resp. rate 18, height 5' 5.5" (1.664 m), weight 77.111 kg (170 lb), SpO2 100 %.   General: alert & oriented x 3 In NAD   Cardiovascular: RRR, nl S1 s2   Respiratory: Decreased breath sounds at the bases, scattered rhonchi, no crackles    Abdomen: soft +BS NT/ND, no masses palpable   Extremities: No cyanosis and no edema       Discharge Instructions    Diet - low sodium heart healthy    Complete by:  As directed      Increase activity slowly    Complete by:  As directed            Follow-up Information    Follow up with NNODI, ADAKU, MD. Schedule an appointment as soon as possible for a visit in 3 days.   Specialty:  Family Medicine   Why:  BMP, CBC in 2-3 days   Contact information:   [address]  [place] [address] [phone]       Signed: Reyne Dumas 11/21/2014, 10:39 AM

## 2014-11-22 LAB — URINE CULTURE: Colony Count: >=100000

## 2014-11-26 LAB — CULTURE, BLOOD (ROUTINE X 2)
Culture: NO GROWTH
Culture: NO GROWTH

## 2014-12-05 ENCOUNTER — Ambulatory Visit: Payer: Medicare Other | Attending: Family Medicine | Admitting: Physical Therapy

## 2014-12-05 DIAGNOSIS — M25551 Pain in right hip: Secondary | ICD-10-CM | POA: Diagnosis not present

## 2014-12-05 DIAGNOSIS — M542 Cervicalgia: Secondary | ICD-10-CM | POA: Insufficient documentation

## 2014-12-05 DIAGNOSIS — M6281 Muscle weakness (generalized): Secondary | ICD-10-CM | POA: Diagnosis present

## 2014-12-05 DIAGNOSIS — M545 Low back pain: Secondary | ICD-10-CM | POA: Diagnosis not present

## 2014-12-08 ENCOUNTER — Ambulatory Visit (INDEPENDENT_AMBULATORY_CARE_PROVIDER_SITE_OTHER): Payer: Medicare Other | Admitting: Neurology

## 2014-12-08 ENCOUNTER — Encounter: Payer: Self-pay | Admitting: Neurology

## 2014-12-08 VITALS — BP 117/77 | HR 80 | Ht 65.5 in | Wt 188.8 lb

## 2014-12-08 DIAGNOSIS — R1310 Dysphagia, unspecified: Secondary | ICD-10-CM

## 2014-12-08 DIAGNOSIS — G609 Hereditary and idiopathic neuropathy, unspecified: Secondary | ICD-10-CM

## 2014-12-08 DIAGNOSIS — G729 Myopathy, unspecified: Secondary | ICD-10-CM

## 2014-12-08 DIAGNOSIS — M6281 Muscle weakness (generalized): Secondary | ICD-10-CM

## 2014-12-08 DIAGNOSIS — M6289 Other specified disorders of muscle: Secondary | ICD-10-CM

## 2014-12-08 DIAGNOSIS — R202 Paresthesia of skin: Secondary | ICD-10-CM

## 2014-12-08 DIAGNOSIS — E538 Deficiency of other specified B group vitamins: Secondary | ICD-10-CM | POA: Diagnosis not present

## 2014-12-08 NOTE — Progress Notes (Signed)
[facility]    Provider:  Dr Jaynee Eagles Referring Provider: Gavin Pound, MD Primary Care Physician:  Gavin Pound, MD  CC:  Muscle Weakness  HPI:  Jacqueline Moyer is a 59 y.o. female here as a referral from Dr. Orland Penman for muscle weakness. PMHx of HTN, UTI, malnutrition, hypothyroidism, gerd, depression, colon cancer s/p colectomy in 2006. Over the last 6 months she is losing function in her lower extremities and severe weakness in her upper extremities and has a hard time holding her neck up. She is having a difficult time walking. She can't get up out of low seats, can't climb stairs, she has to use her arms to lift her leg up, she has difficulty lifting arms overhead. Combination of pain and weakness. Getting progressively worse. She has had multiple medical problems lately as well. She has chronic back and hip problems. She gets tired brushing hair. Has neck pain. Denies radicular symptoms. Solids are getting stuck in her throat. Her neck muscles are weak, straining to hold head up, her head drops a lot. Not dependent on time of day. Worse with exertion. No droopy eyelids or diplopia. No respiratory problems. Hands have been going numb a lot, she looks down and they are white. She has numbness, tingling in the fingers and toes. No FHx of neuromuscular or neurodegenerative disorders. She has been having significant swelling in the legs. Her legs are weeping, she is wrapped up all the way up her legs or her pants get soaked. She is having to change these wrappings every 2 hours.    Reviewed notes, labs and imaging from outside physicians, which showed: recently admitted to [facility] for nausea and vomiting with abdominal pain causing her to lose 20 pounds in the last few months, was found to have significant dehydration, hypokalemia and hyponatremia after starting lasix and in the setting of emesis and loose stools with a UTI. While in the hospital she reported to have  generalized weakness in the context of these multiple medical problems.   B12 375 folate 12.4 12/2012,  TSH 0.082 (L), hgba1c 7.6 2014, BMP with low potassium 3.2 12/28 and hyponatremia 131 12/28,   Latest labs 11/27/2014 bmp wnl, tsh .56, cbc with elevated plts (500) and mildly decreased MCV 79, mag wnl  MRI lumbar spine 02/2013: IMPRESSION: Progression of spinal stenosis at L3-4. Progression of spinal stenosis and left lateral recess encroachment L4-5.  MRI cervical spine IMPRESSION: Degenerative cervical spondylosis with multilevel disc disease and facet disease. 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 Although this could represent acute arthritis secondary to ordinary osteoarthritis, the appearance and extensive inflammatory changes also raise the possibility of of inflammatory arthropathy such as rheumatoid or crystal arthropathy. Joint aspiration should be considered. Chronic septic arthritis is unlikely in the absence of instrumentation.    Echo: Left ventricle: The cavity size was normal. Wall thickness was normal. Systolic function was normal. The estimated ejection fraction was in the range of 60% to 65%. Wall motion was normal; there were no regional wall motion abnormalities. Features are consistent with a pseudonormal left ventricular filling pattern, with concomitant abnormal relaxation and increased filling pressure (grade 2 diastolic dysfunction).    Review of Systems: Patient complains of symptoms per HPI as well as the following symptoms: weight loss, fatigue, easy bruising, swelling in legs, increased thirst, weakness, dizziness,  joint pain, constipation, aching muscles, not enough sleep. Pertinent negatives per HPI. All others negative.   History   Social History  .  Marital Status: Divorced    Spouse Name: Leanna Sato     Number of Children: 1  . Years of Education: 12+   Occupational History  . Disabled     Social History Main Topics  . Smoking status: Current Some Day Smoker -- 0.25 packs/day for 40 years    Types: Cigarettes  . Smokeless tobacco: Current User    Last Attempt to Quit: 04/04/2013  . Alcohol Use: No  . Drug Use: No  . Sexual Activity: No   Other Topics Concern  . Not on file   Social History Narrative   Patient lives at home with life partner Leanna Sato    Patient has 1 child    Patient is disabled    Patient has a BS degree       Family History  Problem Relation Age of Onset  . Colon cancer Father 27    Past Medical History  Diagnosis Date  . Hypertension   . Thyroid disease     hyperthyroidism  . Chronic back pain   . Chronic neck pain   . Depression   . COPD (chronic obstructive pulmonary disease)   . Anemia   . Cancer approx 2006    colon cancer  . Blood transfusion without reported diagnosis     Past Surgical History  Procedure Laterality Date  . Cholecystectomy    . Tonsillectomy    . Abdominal hysterectomy    . Hernia repair    . Colon surgery  approx. 2006    for colon cancer  . Gastric bypass  2000    Current Outpatient Prescriptions  Medication Sig Dispense Refill  . amoxicillin (AMOXIL) 500 MG capsule Take 500 mg by mouth 3 (three) times daily.    . fentaNYL (DURAGESIC - DOSED MCG/HR) 50 MCG/HR   0  . furosemide (LASIX) 40 MG tablet Take 1 tablet by mouth daily. For 4 days  0  . levothyroxine (SYNTHROID, LEVOTHROID) 150 MCG tablet   2  . omeprazole (PRILOSEC) 20 MG capsule Take 20 mg by mouth daily.    . ondansetron (ZOFRAN) 4 MG tablet Take 1 tablet (4 mg total) by mouth every 8 (eight) hours as needed for nausea or vomiting (1 pill by mouth as needed for nausea/vomiting x1, then repeat x1 the day before the procedure and also the day of the procedure with prep x1). 4 tablet 0    . oxyCODONE-acetaminophen (PERCOCET) 10-325 MG per tablet Take 1 tablet by mouth every 4 (four) hours as needed for pain.     . potassium chloride SA (K-DUR,KLOR-CON) 20 MEQ tablet Take 3 tablets (60 mEq total) by mouth daily. 30 tablet 0  . sertraline (ZOLOFT) 100 MG tablet Take 100 mg by mouth every other day.      No current facility-administered medications for this visit.    Allergies as of 12/08/2014  . (No Known Allergies)    Vitals: BP 117/77 mmHg  Pulse 80  Ht 5' 5.5" (1.664 m)  Wt 188 lb 12.8 oz (85.639 kg)  BMI 30.93 kg/m2 Last Weight:  Wt Readings from Last 1 Encounters:  12/08/14 188 lb 12.8 oz (85.639 kg)   Last Height:   Ht Readings from Last 1 Encounters:  12/08/14 5' 5.5" (1.664 m)     Physical exam: Exam: Gen: NAD, conversant, well nourised, obese  vs significant edema in the lower extremities, well groomed                     CV: RRR, no MRG. No Carotid Bruits. Significant peripheral LE edema extending above the knees, with weeping  Eyes: Conjunctivae clear without exudates or hemorrhage  Neuro: Detailed Neurologic Exam  Speech:    Speech is normal; fluent and spontaneous with normal comprehension.  Cognition:    The patient is oriented to person, place, and time;     recent and remote memory intact;     language fluent;     normal attention, concentration,     fund of knowledge Cranial Nerves:    The pupils are equal, round, and reactive to light. Could not visualize fundi. Visual fields are full to finger confrontation. Extraocular movements are intact. Trigeminal sensation is intact and the muscles of mastication are normal. The face is symmetric. The palate elevates in the midline. Hearing intact. Voice is normal. Shoulder shrug is normal. The tongue has normal motion without fasciculations.   Coordination:    Normal finger to nose. Cannot perform HTS due to edema.   Gait:    Can heel and toe walk with difficulty. Wide-based gait due to lower  extremity significant edema.   Motor Observation: Has to use her hands to get out of the chair.     No asymmetry, no atrophy. Mild postural tremor.   Tone:    Normal muscle tone.    Posture:    Posture is normal.     Strength:   Deltoid and triceps 3/5   Hip flexion 2+/5   HS 3/5    Otherwise strength is intact.      Sensation: intact to LT     Reflex Exam:  DTR's:    Deep tendon reflexes in the upper extremities normal bilaterally.  Cannot assess lowers due to significant edema. Toes:    Cannot assess due to LE edema Clonus:    Clonus is absent.     Assessment/Plan:   Jacqueline Moyer is a 59 y.o. female here as a referral from Dr. Orland Penman for muscle weakness. PMHx of HTN, UTI, malnutrition, hypothyroidism, gerd, depression, colon cancer s/p colectomy in 2006. Over the last 6 months she is losing function in her lower extremities and severe weakness in her upper extremities and has a hard time holding her neck up. She is having a difficult time walking. She can't get up out of low seats, can't climb stairs, she has to use her arms to lift her leg up, she has difficulty lifting arms overhead. Combination of pain and weakness.  Exam shows proximal > distal weakness. Will order extensive lab testing to evaluate for conditions that can cause proximal weakness such as myopathy and also order an emg/ncs to further evaluate weakness. EMG/NCS will be limited as the severe swelling/weeping in her lower extremities will limit procedure to uppers only however still may be able to see myopathy/myositis if present. Can also consider MRI of muscle after emg/ncs.   Sarina Ill, MD  [facility] [address] [place], [place] [address]  Phone [phone] Fax [phone]  A total of 80 minutes was spent in with this patient. Over half this time was spent on counseling patient on the diagnosis of possible myopathy and different diagnostic and  therapeutic  options available.

## 2014-12-08 NOTE — Patient Instructions (Signed)
Overall you are doing fairly well but I do want to suggest a few things today:   Remember to drink plenty of fluid, eat healthy meals and do not skip any meals. Try to eat protein with a every meal and eat a healthy snack such as fruit or nuts in between meals. Try to keep a regular sleep-wake schedule and try to exercise daily, particularly in the form of walking, 20-30 minutes a day, if you can.   As far as diagnostic testing: EMG/NCS, Labs  I would like to see you back within 2 weeks for emg/ncs , sooner if we need to. Please call us with any interim questions, concerns, problems, updates or refill requests.   Please also call us for any test results so we can go over those with you on the phone.  My clinical assistant and will answer any of your questions and relay your messages to me and also relay most of my messages to you.   Our phone number is [phone]. We also have an after hours call service for urgent matters and there is a physician on-call for urgent questions. For any emergencies you know to call 911 or go to the nearest emergency room

## 2014-12-09 DIAGNOSIS — M6281 Muscle weakness (generalized): Secondary | ICD-10-CM | POA: Insufficient documentation

## 2014-12-09 LAB — RPR: RPR Ser Ql: NONREACTIVE

## 2014-12-09 NOTE — Addendum Note (Signed)
Addended by: Sarina Ill B on: 12/09/2014 03:28 PM   Modules accepted: Level of Service

## 2014-12-11 NOTE — Addendum Note (Signed)
Addended by: Sarina Ill B on: 12/11/2014 09:50 AM   Modules accepted: Orders

## 2014-12-12 ENCOUNTER — Ambulatory Visit: Payer: Medicare Other

## 2014-12-14 ENCOUNTER — Ambulatory Visit: Payer: Medicare Other | Admitting: Physical Therapy

## 2014-12-14 LAB — PAN-ANCA
C-ANCA: <1:20 {titer}
Myeloperoxidase Ab: <9 U/mL (ref 0.0–9.0)
P-ANCA: <1:20 {titer}

## 2014-12-14 LAB — ALDOLASE: ALDOLASE: 5.2 U/L (ref 3.3–10.3)

## 2014-12-14 LAB — IFE AND PE, SERUM
ALBUMIN/GLOB SERPL: 1.2 (ref 0.7–2.0)
Albumin SerPl Elph-Mcnc: 2.9 g/dL — ABNORMAL LOW (ref 3.2–5.6)
Alpha 1: 0.3 g/dL (ref 0.1–0.4)
Alpha2 Glob SerPl Elph-Mcnc: 0.8 g/dL (ref 0.4–1.2)
B-GLOBULIN SERPL ELPH-MCNC: 0.8 g/dL (ref 0.6–1.3)
Gamma Glob SerPl Elph-Mcnc: 0.6 g/dL (ref 0.5–1.6)
Globulin, Total: 2.5 g/dL (ref 2.0–4.5)
IGM (IMMUNOGLOBULIN M), SRM: 42 mg/dL (ref 40–230)
IgA/Immunoglobulin A, Serum: 95 mg/dL (ref 91–414)
IgG (Immunoglobin G), Serum: 578 mg/dL — ABNORMAL LOW (ref 700–1600)
Total Protein: 5.4 g/dL — ABNORMAL LOW (ref 6.0–8.5)

## 2014-12-14 LAB — PARANEOPLASTIC PROFILE 1

## 2014-12-14 LAB — MYOSITIS PANEL III
EJ*: NEGATIVE
JO-1 (WB): NEGATIVE
KU: NEGATIVE
Mi-2 antibodies*: NEGATIVE
OJ: NEGATIVE
PL-12*: NEGATIVE
PL-7: NEGATIVE
PM-Scl 100*: NEGATIVE
PM-Scl 75*: NEGATIVE
RNP: 19 EU/ml
RO-52*: NEGATIVE
Signal Recognition Particle*: NEGATIVE

## 2014-12-14 LAB — ACETYLCHOLINE RECEPTOR, BLOCKING: ACHR BLOCKING ABS, SERUM: 18 % (ref 0–25)

## 2014-12-14 LAB — HEPATITIS C ANTIBODY

## 2014-12-14 LAB — METHYLMALONIC ACID, SERUM: Methylmalonic Acid: 265 nmol/L (ref 0–378)

## 2014-12-14 LAB — ANGIOTENSIN CONVERTING ENZYME: ANGIO CONVERT ENZYME: 85 U/L — AB (ref 14–82)

## 2014-12-14 LAB — ACETYLCHOLINE RECEPTOR, BINDING: AChR Binding Ab, Serum: <0.03 nmol/L (ref 0.00–0.24)

## 2014-12-14 LAB — LACTATE DEHYDROGENASE: LDH: 227 IU/L — ABNORMAL HIGH (ref 119–226)

## 2014-12-14 LAB — HIV ANTIBODY (ROUTINE TESTING W REFLEX)
HIV 1/O/2 Abs-Index Value: <1 (ref <1.00)
HIV-1/HIV-2 Ab: NONREACTIVE

## 2014-12-14 LAB — B12 AND FOLATE PANEL
FOLATE: 7.6 ng/mL (ref >3.0)
Vitamin B-12: 574 pg/mL (ref 211–946)

## 2014-12-14 LAB — HEAVY METALS, BLOOD
ARSENIC: 11 ug/L (ref 2–23)
LEAD, BLOOD: 1 ug/dL (ref 0–19)
MERCURY: 1.6 ug/L (ref 0.0–14.9)

## 2014-12-14 LAB — VGCC ANTIBODY: VGCC ANTIBODY: NEGATIVE

## 2014-12-14 LAB — SEDIMENTATION RATE: SED RATE: 10 mm/h (ref 0–40)

## 2014-12-14 LAB — HIGH SENSITIVITY CRP: CRP HIGH SENSITIVITY: 4.4 mg/L — AB (ref 0.00–3.00)

## 2014-12-14 LAB — ACETYLCHOLINE RECEPTOR, MODULATING: Acetylcholine Rec Mod Ab: <12 % (ref 0–20)

## 2014-12-14 LAB — MYOGLOBIN, SERUM: MYOGLOBIN: 63 ng/mL — AB (ref 25–58)

## 2014-12-14 LAB — C3 COMPLEMENT: C3 COMPLEMENT: 98 mg/dL (ref 90–180)

## 2014-12-14 LAB — RPR QUALITATIVE: RPR Ser Ql: NONREACTIVE

## 2014-12-14 LAB — CK: Total CK: 54 U/L (ref 24–173)

## 2014-12-14 LAB — ANA W/REFLEX: ANA: NEGATIVE

## 2014-12-14 LAB — GLIADIN IGG/IGA AB PROF, EIA
Antigliadin Abs, IgA: 2 units (ref 0–19)
GLIADIN IGG: 1 U (ref 0–19)

## 2014-12-14 LAB — TISSUE TRANSGLUTAMINASE, IGA: Transglutaminase IgA: <2 U/mL (ref 0–3)

## 2014-12-14 LAB — LACTIC ACID, PLASMA: LACTATE: 11.5 mg/dL (ref 4.5–19.8)

## 2014-12-14 LAB — C4 COMPLEMENT: C4 COMPLEMENT: 24 mg/dL (ref 9–36)

## 2014-12-14 LAB — PARATHYROID HORMONE, INTACT (NO CA): PTH: 26 pg/mL (ref 15–65)

## 2014-12-18 ENCOUNTER — Emergency Department (HOSPITAL_COMMUNITY)
Admission: EM | Admit: 2014-12-18 | Discharge: 2014-12-18 | Disposition: A | Discharge status: Home / Self Care | Payer: Medicare Other | Attending: Emergency Medicine | Admitting: Emergency Medicine

## 2014-12-18 ENCOUNTER — Encounter (HOSPITAL_COMMUNITY): Payer: Self-pay | Admitting: *Deleted

## 2014-12-18 DIAGNOSIS — Z720 Tobacco use: Secondary | ICD-10-CM | POA: Insufficient documentation

## 2014-12-18 DIAGNOSIS — Z79899 Other long term (current) drug therapy: Secondary | ICD-10-CM | POA: Diagnosis not present

## 2014-12-18 DIAGNOSIS — I872 Venous insufficiency (chronic) (peripheral): Secondary | ICD-10-CM | POA: Diagnosis not present

## 2014-12-18 DIAGNOSIS — M79604 Pain in right leg: Secondary | ICD-10-CM

## 2014-12-18 DIAGNOSIS — Z862 Personal history of diseases of the blood and blood-forming organs and certain disorders involving the immune mechanism: Secondary | ICD-10-CM | POA: Insufficient documentation

## 2014-12-18 DIAGNOSIS — I10 Essential (primary) hypertension: Secondary | ICD-10-CM | POA: Diagnosis not present

## 2014-12-18 DIAGNOSIS — J449 Chronic obstructive pulmonary disease, unspecified: Secondary | ICD-10-CM | POA: Insufficient documentation

## 2014-12-18 DIAGNOSIS — Z85038 Personal history of other malignant neoplasm of large intestine: Secondary | ICD-10-CM | POA: Diagnosis not present

## 2014-12-18 DIAGNOSIS — G8929 Other chronic pain: Secondary | ICD-10-CM | POA: Insufficient documentation

## 2014-12-18 DIAGNOSIS — F329 Major depressive disorder, single episode, unspecified: Secondary | ICD-10-CM | POA: Diagnosis not present

## 2014-12-18 DIAGNOSIS — M7989 Other specified soft tissue disorders: Secondary | ICD-10-CM

## 2014-12-18 DIAGNOSIS — M79609 Pain in unspecified limb: Secondary | ICD-10-CM

## 2014-12-18 DIAGNOSIS — E0590 Thyrotoxicosis, unspecified without thyrotoxic crisis or storm: Secondary | ICD-10-CM | POA: Diagnosis not present

## 2014-12-18 LAB — URINALYSIS, ROUTINE W REFLEX MICROSCOPIC
BILIRUBIN URINE: NEGATIVE
Glucose, UA: NEGATIVE mg/dL
HGB URINE DIPSTICK: NEGATIVE
Ketones, ur: NEGATIVE mg/dL
Nitrite: NEGATIVE
PROTEIN: NEGATIVE mg/dL
SPECIFIC GRAVITY, URINE: 1.017 (ref 1.005–1.030)
Urobilinogen, UA: 0.2 mg/dL (ref 0.0–1.0)
pH: 6 (ref 5.0–8.0)

## 2014-12-18 LAB — URINE MICROSCOPIC-ADD ON

## 2014-12-18 LAB — CBC
HCT: 36.4 % (ref 36.0–46.0)
HEMOGLOBIN: 11.7 g/dL — AB (ref 12.0–15.0)
MCH: 26.3 pg (ref 26.0–34.0)
MCHC: 32.1 g/dL (ref 30.0–36.0)
MCV: 81.8 fL (ref 78.0–100.0)
Platelets: 464 10*3/uL — ABNORMAL HIGH (ref 150–400)
RBC: 4.45 MIL/uL (ref 3.87–5.11)
RDW: 21.1 % — ABNORMAL HIGH (ref 11.5–15.5)
WBC: 13.4 10*3/uL — ABNORMAL HIGH (ref 4.0–10.5)

## 2014-12-18 LAB — BASIC METABOLIC PANEL
Anion gap: 9 (ref 5–15)
BUN: 21 mg/dL (ref 6–23)
CALCIUM: 8.2 mg/dL — AB (ref 8.4–10.5)
CO2: 26 mmol/L (ref 19–32)
Chloride: 96 mmol/L (ref 96–112)
Creatinine, Ser: 0.79 mg/dL (ref 0.50–1.10)
GFR calc Af Amer: >90 mL/min (ref >90)
GFR calc non Af Amer: >90 mL/min (ref >90)
Glucose, Bld: 112 mg/dL — ABNORMAL HIGH (ref 70–99)
POTASSIUM: 3.7 mmol/L (ref 3.5–5.1)
Sodium: 131 mmol/L — ABNORMAL LOW (ref 135–145)

## 2014-12-18 MED ORDER — HYDROMORPHONE HCL 1 MG/ML IJ SOLN
1.0000 mg | Freq: Once | INTRAMUSCULAR | Status: AC
  Administered 2014-12-18: 1 mg via INTRAVENOUS
  Filled 2014-12-18: qty 1

## 2014-12-18 NOTE — ED Notes (Signed)
Per EMS, pt has had bilateral leg pain and swelling for the past several weeks, which became worse yesterday. Pt states her legs have been weeping for the past week. Pt states she is on Lasix for the past 2 weeks. Pt denies difficulty breathing. Pt has chronic back and hip pain, is wearing fentanyl patch.

## 2014-12-18 NOTE — Progress Notes (Signed)
*  Preliminary Results* Right lower extremity venous duplex completed. Right lower extremity is negative for deep vein thrombosis. There is no evidence of right Baker's cyst.  12/18/2014 6:45 PM  Maudry Mayhew, RVT, RDCS, RDMS

## 2014-12-18 NOTE — Discharge Instructions (Signed)
Return to the ED with any concerns including chest pain, shortness of breath, vomiting and not able to keep down liquids, increased leg swelling, fever/chills, decreased level of alertness/lethargy, or any other alarming symptoms

## 2014-12-18 NOTE — ED Notes (Signed)
Bed: WA02 Expected date:  Expected time:  Means of arrival:  Comments: EMS- BLE swelling

## 2014-12-18 NOTE — ED Provider Notes (Signed)
CSN: [id number]     Arrival date & time 12/18/14  1641 History   First MD Initiated Contact with Patient 12/18/14 1648     Chief Complaint  Patient presents with  . Leg Swelling  . Leg Pain     (Consider location/radiation/quality/duration/timing/severity/associated sxs/prior Treatment) HPI  Pt presenting with c/o right thigh and lower extremity pain.  Pt states she has chronic pain in both legs and is being worked up for rheumatoid arthritis, however she states that starting today the pain in her right groin/thigh and calf are worse than normal.  She has fentanyl patch and takes hydrocodone at home which she states is not helping her pain.  Her lower extremities are both swollen but she states that she is taking lasix and the swelling has overall decreased signficantly.  Was hospitalized approx 3 weeks ago for hypokalemia.  Her husband states she has not been eating well and has been having more stress due to a breast lump that was discovered last week.  She states she is getting this worked up and is also in the process of being checked for myasthenia gravis and is very anxious about this.  There are no other associated systemic symptoms, there are no other alleviating or modifying factors.   No trauma to her leg.  No different activities.  No fever/chills.  Past Medical History  Diagnosis Date  . Hypertension   . Thyroid disease     hyperthyroidism  . Chronic back pain   . Chronic neck pain   . Depression   . COPD (chronic obstructive pulmonary disease)   . Anemia   . Cancer approx 2006    colon cancer  . Blood transfusion without reported diagnosis    Past Surgical History  Procedure Laterality Date  . Cholecystectomy    . Tonsillectomy    . Abdominal hysterectomy    . Hernia repair    . Colon surgery  approx. 2006    for colon cancer  . Gastric bypass  2000   Family History  Problem Relation Age of Onset  . Colon cancer Father 28   History  Substance Use Topics  .  Smoking status: Current Some Day Smoker -- 0.25 packs/day for 40 years    Types: Cigarettes  . Smokeless tobacco: Current User    Last Attempt to Quit: 04/04/2013  . Alcohol Use: No   OB History    No data available     Review of Systems  ROS reviewed and all otherwise negative except for mentioned in HPI    Allergies  Review of patient's allergies indicates no known allergies.  Home Medications   Prior to Admission medications   Medication Sig Start Date End Date Taking? Authorizing Provider  fentaNYL (DURAGESIC - DOSED MCG/HR) 50 MCG/HR Place 50 mcg onto the skin every 3 (three) days.  11/22/14  Yes Historical Provider, MD  furosemide (LASIX) 40 MG tablet Take 1 tablet by mouth daily.  11/14/14  Yes Historical Provider, MD  levothyroxine (SYNTHROID, LEVOTHROID) 150 MCG tablet Take 150 mcg by mouth daily before breakfast.  10/05/14  Yes Historical Provider, MD  omeprazole (PRILOSEC) 20 MG capsule Take 20 mg by mouth daily.   Yes Historical Provider, MD  ondansetron (ZOFRAN) 4 MG tablet Take 1 tablet (4 mg total) by mouth every 8 (eight) hours as needed for nausea or vomiting (1 pill by mouth as needed for nausea/vomiting x1, then repeat x1 the day before the procedure and also the day of  the procedure with prep x1). 08/02/14  Yes Milus Banister, MD  oxyCODONE-acetaminophen (PERCOCET) 10-325 MG per tablet Take 1 tablet by mouth every 4 (four) hours as needed for pain (pain).  06/15/14  Yes Historical Provider, MD  potassium chloride SA (K-DUR,KLOR-CON) 20 MEQ tablet Take 3 tablets (60 mEq total) by mouth daily. Patient taking differently: Take 20 mEq by mouth daily.  11/21/14  Yes Reyne Dumas, MD  sertraline (ZOLOFT) 100 MG tablet Take 100 mg by mouth every other day.    Yes Historical Provider, MD   BP 135/72 mmHg  Pulse 95  Temp(Src) 98.6 F (37 C) (Oral)  Resp 18  SpO2 93%  Vitals reviewed Physical Exam  Physical Examination: General appearance - alert, well appearing, and  in no distress Mental status - alert, oriented to person, place, and time Eyes - no conjunctival injection, no scleral icterus Mouth - mucous membranes moist, pharynx normal without lesions Chest - clear to auscultation, no wheezes, rales or rhonchi, symmetric air entry Heart - normal rate, regular rhythm, normal S1, S2, no murmurs, rubs, clicks or gallops Abdomen - soft, nontender, nondistended, no masses or organomegaly Musculoskeletal -ttp over anterior right hip, right ankle- otherwise no joint tenderness, deformity or swelling Extremities - peripheral pulses normal, bilateral 2+ pedal edema and 1/2 way up tibia- pt states this is much improved from prior, no clubbing or cyanosis Skin - mild erythema over anterior tibial regions bilaterally- pt states this is much improved from prior   ED Course  Procedures (including critical care time) Labs Review Labs Reviewed  CBC - Abnormal; Notable for the following:    WBC 13.4 (*)    Hemoglobin 11.7 (*)    RDW 21.1 (*)    Platelets 464 (*)    All other components within normal limits  BASIC METABOLIC PANEL - Abnormal; Notable for the following:    Sodium 131 (*)    Glucose, Bld 112 (*)    Calcium 8.2 (*)    All other components within normal limits  URINALYSIS, ROUTINE W REFLEX MICROSCOPIC - Abnormal; Notable for the following:    APPearance CLOUDY (*)    Leukocytes, UA LARGE (*)    All other components within normal limits  URINE MICROSCOPIC-ADD ON - Abnormal; Notable for the following:    Crystals CA OXALATE CRYSTALS (*)    All other components within normal limits  URINE CULTURE    Imaging Review No results found.   EKG Interpretation None      MDM   Final diagnoses:  Lower extremity pain, diffuse, right  Venous stasis dermatitis of both lower extremities    Pt presenting with c/o right lower extremity pain, her lower extremity swelling is improved from recent admission per patient.  Her recent admission was for  hypokalemia- potassium is reassuring tonight.  Urinalysis is equivocal- she had equivocal UA at last admission- then urine culture was multiple organisms.  Will resend urine culture tonight.  Pt feels much improved after IV pain meds in the ED.  No sign of DVT on venous duplex.  Discussed all results with patient and she is agreeable with plan.  Discharged with strict return precautions.  Pt agreeable with plan.    Threasa Beards, MD 12/18/14 [phone]

## 2014-12-19 ENCOUNTER — Ambulatory Visit: Payer: Medicare Other

## 2014-12-20 ENCOUNTER — Other Ambulatory Visit: Payer: Self-pay | Admitting: Radiology

## 2014-12-20 LAB — URINE CULTURE
COLONY COUNT: NO GROWTH
Culture: NO GROWTH

## 2014-12-22 ENCOUNTER — Ambulatory Visit (INDEPENDENT_AMBULATORY_CARE_PROVIDER_SITE_OTHER): Payer: Medicare Other | Admitting: Neurology

## 2014-12-22 ENCOUNTER — Telehealth: Payer: Self-pay | Admitting: *Deleted

## 2014-12-22 ENCOUNTER — Ambulatory Visit (INDEPENDENT_AMBULATORY_CARE_PROVIDER_SITE_OTHER): Payer: Self-pay | Admitting: Neurology

## 2014-12-22 DIAGNOSIS — R531 Weakness: Secondary | ICD-10-CM

## 2014-12-22 DIAGNOSIS — W57XXXA Bitten or stung by nonvenomous insect and other nonvenomous arthropods, initial encounter: Secondary | ICD-10-CM

## 2014-12-22 DIAGNOSIS — G729 Myopathy, unspecified: Secondary | ICD-10-CM

## 2014-12-22 DIAGNOSIS — C50512 Malignant neoplasm of lower-outer quadrant of left female breast: Secondary | ICD-10-CM

## 2014-12-22 DIAGNOSIS — R1310 Dysphagia, unspecified: Secondary | ICD-10-CM

## 2014-12-22 DIAGNOSIS — Z0289 Encounter for other administrative examinations: Secondary | ICD-10-CM

## 2014-12-22 NOTE — Telephone Encounter (Signed)
Left message for a return phone call to schedule for [facility]. Awaiting patient response.  

## 2014-12-22 NOTE — Telephone Encounter (Signed)
Received call back from patient.  Confirmed [facility] for 12/27/14 at 8am .  Instructions and contact information given.

## 2014-12-24 NOTE — Procedures (Signed)
  [facility]    Provider:  Dr Jaynee Eagles Referring Provider: Gavin Pound, MD Primary Care Physician:  Gavin Pound, MD  History:  Jacqueline Moyer is a 59 y.o. female here as a referral from Dr. Orland Penman for muscle weakness. PMHx of HTN, UTI, malnutrition, hypothyroidism, gerd, depression, colon cancer s/p colectomy in 2006. Over the last 6 months she is losing function in her lower extremities and severe weakness in her upper extremities and has a hard time holding her neck up. She is having a difficult time walking. She can't get up out of low seats, can't climb stairs, she has to use her arms to lift her leg up, she has difficulty lifting arms overhead. Combination of pain and weakness. Getting progressively worse. She has had multiple medical problems lately as well. She has chronic back and hip problems. She gets tired brushing hair. Has neck pain. Denies radicular symptoms. Solids are getting stuck in her throat. Her neck muscles are weak, straining to hold head up, her head drops a lot. Not dependent on time of day. Worse with exertion. No droopy eyelids or diplopia. No respiratory problems. Hands have been going numb a lot, she looks down and they are white. She has numbness, tingling in the fingers and toes. No FHx of neuromuscular or neurodegenerative disorders. She has been having significant swelling in the legs. Her legs are weeping, she is wrapped up all the way up her legs or her pants get soaked. She is having to change these wrappings every 2 hours.   Summary  Nerve conduction studies were performed on the bilateral upper extremities:  The Median motor nerves showed normal conductions with normal F Wave latencies The Ulnar motor nerves showed normal conductions with normal F Wave latencies The bilateral second-digit Median sensory nerves were within normal limits The bilateral fifth-digit Ulnar sensory nerves were within normal limits The bilateral Radial sensory  nerves were within normal limits  EMG Needle study was performed on selected muscles:   The right Deltoid, right Biceps, right Triceps, right Pronator Teres, right Opponens Pollicis, right First Dorsal interosseous muscles and bilateral C5/C6 paraspinal muscles were within normal limits.  Conclusion: This is a normal study. No electrophysiologic evidence for ulnar or median neuropathy, peripheral polyneuropathy, radiculopathy or myopathy. Could not evaluate the lower extremities due to severe edema. Extensive serum labwork evaluation has failed to find the etiology of her symptoms, will order MRI of the brain and cervical cord as well as speech/swallow evaluation.      Sarina Ill, MD  [facility] [address] [place], [place] [address]  Phone [phone] Fax [phone]

## 2014-12-25 LAB — B. BURGDORFI ANTIBODIES: Lyme IgG/IgM Ab: <0.91 {ISR} (ref 0.00–0.90)

## 2014-12-26 ENCOUNTER — Telehealth: Payer: Self-pay | Admitting: *Deleted

## 2014-12-26 ENCOUNTER — Other Ambulatory Visit: Payer: Self-pay | Admitting: Radiology

## 2014-12-26 ENCOUNTER — Ambulatory Visit: Payer: Medicare Other | Attending: Family Medicine

## 2014-12-26 DIAGNOSIS — M542 Cervicalgia: Secondary | ICD-10-CM | POA: Insufficient documentation

## 2014-12-26 DIAGNOSIS — M6281 Muscle weakness (generalized): Secondary | ICD-10-CM | POA: Diagnosis present

## 2014-12-26 DIAGNOSIS — M25551 Pain in right hip: Secondary | ICD-10-CM | POA: Insufficient documentation

## 2014-12-26 DIAGNOSIS — C50912 Malignant neoplasm of unspecified site of left female breast: Secondary | ICD-10-CM

## 2014-12-26 DIAGNOSIS — M545 Low back pain: Secondary | ICD-10-CM | POA: Insufficient documentation

## 2014-12-26 NOTE — Telephone Encounter (Signed)
-----   Message from Melvenia Beam, MD sent at 12/25/2014  5:07 PM EST ----- Please let patient know her lyme test was negative. Thank you

## 2014-12-26 NOTE — Telephone Encounter (Signed)
Left voicemail for patient to call back. 

## 2014-12-27 ENCOUNTER — Encounter: Payer: Self-pay | Admitting: Hematology and Oncology

## 2014-12-27 ENCOUNTER — Telehealth: Payer: Self-pay | Admitting: Hematology and Oncology

## 2014-12-27 ENCOUNTER — Ambulatory Visit (HOSPITAL_BASED_OUTPATIENT_CLINIC_OR_DEPARTMENT_OTHER): Payer: Medicare Other | Admitting: Hematology and Oncology

## 2014-12-27 ENCOUNTER — Encounter: Payer: Self-pay | Admitting: Physical Therapy

## 2014-12-27 ENCOUNTER — Ambulatory Visit: Payer: Medicare Other

## 2014-12-27 ENCOUNTER — Encounter: Payer: Self-pay | Admitting: Skilled Nursing Facility1

## 2014-12-27 ENCOUNTER — Ambulatory Visit: Payer: Medicare Other | Admitting: Physical Therapy

## 2014-12-27 ENCOUNTER — Encounter: Payer: Self-pay | Admitting: *Deleted

## 2014-12-27 ENCOUNTER — Ambulatory Visit
Admission: RE | Admit: 2014-12-27 | Discharge: 2014-12-27 | Disposition: A | Discharge status: Home / Self Care | Payer: Medicare Other | Source: Ambulatory Visit | Attending: Radiation Oncology | Admitting: Radiation Oncology

## 2014-12-27 ENCOUNTER — Other Ambulatory Visit (HOSPITAL_BASED_OUTPATIENT_CLINIC_OR_DEPARTMENT_OTHER): Payer: Medicare Other

## 2014-12-27 VITALS — BP 143/64 | HR 81 | Temp 97.9°F | Resp 18 | Ht 65.5 in | Wt 182.1 lb

## 2014-12-27 DIAGNOSIS — C50912 Malignant neoplasm of unspecified site of left female breast: Secondary | ICD-10-CM

## 2014-12-27 DIAGNOSIS — M25611 Stiffness of right shoulder, not elsewhere classified: Secondary | ICD-10-CM

## 2014-12-27 DIAGNOSIS — C50512 Malignant neoplasm of lower-outer quadrant of left female breast: Secondary | ICD-10-CM

## 2014-12-27 DIAGNOSIS — C50812 Malignant neoplasm of overlapping sites of left female breast: Secondary | ICD-10-CM

## 2014-12-27 DIAGNOSIS — R531 Weakness: Secondary | ICD-10-CM

## 2014-12-27 DIAGNOSIS — Z170 Estrogen receptor positive status [ER+]: Secondary | ICD-10-CM

## 2014-12-27 DIAGNOSIS — M6281 Muscle weakness (generalized): Secondary | ICD-10-CM | POA: Diagnosis not present

## 2014-12-27 DIAGNOSIS — R262 Difficulty in walking, not elsewhere classified: Secondary | ICD-10-CM

## 2014-12-27 DIAGNOSIS — E46 Unspecified protein-calorie malnutrition: Secondary | ICD-10-CM

## 2014-12-27 DIAGNOSIS — R293 Abnormal posture: Secondary | ICD-10-CM

## 2014-12-27 DIAGNOSIS — M25612 Stiffness of left shoulder, not elsewhere classified: Secondary | ICD-10-CM

## 2014-12-27 DIAGNOSIS — M7989 Other specified soft tissue disorders: Secondary | ICD-10-CM

## 2014-12-27 LAB — COMPREHENSIVE METABOLIC PANEL (CC13)
ALT: 15 U/L (ref 0–55)
AST: 17 U/L (ref 5–34)
Albumin: 2.5 g/dL — ABNORMAL LOW (ref 3.5–5.0)
Alkaline Phosphatase: 119 U/L (ref 40–150)
Anion Gap: 10 mEq/L (ref 3–11)
BUN: 20.2 mg/dL (ref 7.0–26.0)
CALCIUM: 8.2 mg/dL — AB (ref 8.4–10.4)
CHLORIDE: 99 meq/L (ref 98–109)
CO2: 25 mEq/L (ref 22–29)
Creatinine: 0.8 mg/dL (ref 0.6–1.1)
EGFR: 80 mL/min/{1.73_m2} — ABNORMAL LOW (ref >90)
Glucose: 109 mg/dl (ref 70–140)
Potassium: 3.6 mEq/L (ref 3.5–5.1)
SODIUM: 133 meq/L — AB (ref 136–145)
TOTAL PROTEIN: 5 g/dL — AB (ref 6.4–8.3)
Total Bilirubin: 0.35 mg/dL (ref 0.20–1.20)

## 2014-12-27 LAB — CBC WITH DIFFERENTIAL/PLATELET
BASO%: 0.5 % (ref 0.0–2.0)
BASOS ABS: 0 10*3/uL (ref 0.0–0.1)
EOS ABS: 0 10*3/uL (ref 0.0–0.5)
EOS%: 0.5 % (ref 0.0–7.0)
HCT: 34.6 % — ABNORMAL LOW (ref 34.8–46.6)
HEMOGLOBIN: 11.1 g/dL — AB (ref 11.6–15.9)
LYMPH#: 1.6 10*3/uL (ref 0.9–3.3)
LYMPH%: 18.8 % (ref 14.0–49.7)
MCH: 26.8 pg (ref 25.1–34.0)
MCHC: 32.2 g/dL (ref 31.5–36.0)
MCV: 83.3 fL (ref 79.5–101.0)
MONO#: 0.7 10*3/uL (ref 0.1–0.9)
MONO%: 8.5 % (ref 0.0–14.0)
NEUT#: 6.2 10*3/uL (ref 1.5–6.5)
NEUT%: 71.7 % (ref 38.4–76.8)
Platelets: 402 10*3/uL — ABNORMAL HIGH (ref 145–400)
RBC: 4.16 10*6/uL (ref 3.70–5.45)
RDW: 23.1 % — ABNORMAL HIGH (ref 11.2–14.5)
WBC: 8.7 10*3/uL (ref 3.9–10.3)

## 2014-12-27 MED ORDER — ANASTROZOLE 1 MG PO TABS: 1.0000 mg | ORAL_TABLET | Freq: Every day | ORAL | Status: AC

## 2014-12-27 NOTE — Telephone Encounter (Signed)
, °

## 2014-12-27 NOTE — Progress Notes (Signed)
[facility] NEW PATIENT EVALUATION  Name: Jacqueline Moyer MRN: [id number]  Date:   12/27/2014           DOB: 1956-07-08  Status: outpatient   CC: NNODI, Doreene Burke, MD  Fanny Skates, MD    REFERRING PHYSICIAN: Fanny Skates, MD   DIAGNOSIS: Stage IA (T1a N0 M0) invasive mammary carcinoma of the left breast   HISTORY OF PRESENT ILLNESS:  Jacqueline Moyer is a 59 y.o. female who is seen today at the breast multidisciplinary clinic through the courtesy of Dr. Dalbert Batman for evaluation of her stage T1 N0 invasive mammary carcinoma of her left breast.  At the time of a screening mammogram at [facility] she was noted to have a small mass along the inferior left breast.  Additional views and ultrasound showed a 0.4 cm mass.  This was biopsied on 12/20/2014 showing invasive and in situ mammary carcinoma with apocrine features.  The tumor was ER/PR positive and HER-2/neu negative with an elevated Ki-67 of 60%.  She is scheduled for left breast MR on February 8.  Of note is that she has been undergoing an extensive evaluation by neurology and also rheumatology for clinical deterioration over the past 3-4 months with progressive generalized weakness and severe lower extremity lymphedema.  I understand that she was recently hospitalized with hypokalemia after being placed on Lasix.  The patient tells me that her cardiac, rheumatologic, and neurologic workup thus far has been negative.  She also reports weakening of her voice for the past month.  She gives a history of bilateral breast reduction surgery 15 years ago, and also a history of early colon cancer not requiring radiation therapy or chemotherapy.  She does have a history of spinal stenosis with chronic lower back pain.  She has difficulty with ambulation.  She gives a history of only eating lettuce for the past number of weeks.  She seen today with Dr. Dalbert Batman and Dr. Lindi Adie.  PREVIOUS RADIATION THERAPY: No   PAST MEDICAL  HISTORY:  has a past medical history of Hypertension; Thyroid disease; Chronic back pain; Chronic neck pain; Depression; COPD (chronic obstructive pulmonary disease); Anemia; Cancer (approx 2006); Blood transfusion without reported diagnosis; GERD (gastroesophageal reflux disease); and Arthritis.     PAST SURGICAL HISTORY:  Past Surgical History  Procedure Laterality Date  . Cholecystectomy    . Tonsillectomy    . Abdominal hysterectomy    . Hernia repair    . Colon surgery  approx. 2006    for colon cancer  . Gastric bypass  2000     FAMILY HISTORY: family history includes Colon cancer (age of onset: 20) in her father; Prostate cancer in her brother.  Divorced, one adopted daughter.  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   SOCIAL HISTORY:  reports that she quit smoking about a year ago. Her smoking use included Cigarettes. She has a 40 pack-year smoking history. She uses smokeless tobacco. She reports that she does not drink alcohol or use illicit drugs.  Her father died from metastatic bladder cancer at age 43.  Her mother is alive and well at age 81 and lives independently.  No family history of breast cancer.   ALLERGIES: Review of patient's allergies indicates no known allergies.   MEDICATIONS:  Current Outpatient Prescriptions  Medication Sig Dispense Refill  . anastrozole (ARIMIDEX) 1 MG tablet Take 1 tablet (1 mg total) by mouth daily. [place]ed  tablet 3  . fentaNYL (DURAGESIC - DOSED MCG/HR) 50 MCG/HR Place 50 mcg onto the skin every 3 (three) days.   0  . furosemide (LASIX) 40 MG tablet Take 1 tablet by mouth daily.   0  . levothyroxine (SYNTHROID, LEVOTHROID) 150 MCG tablet Take 150 mcg by mouth daily before breakfast.   2  . omeprazole (PRILOSEC) 20 MG capsule Take 20 mg by mouth daily.    . ondansetron (ZOFRAN) 4 MG tablet Take 1 tablet (4 mg total) by mouth every 8 (eight) hours as needed for nausea or vomiting (1 pill by  mouth as needed for nausea/vomiting x1, then repeat x1 the day before the procedure and also the day of the procedure with prep x1). 4 tablet 0  . oxyCODONE (ROXICODONE) 15 MG immediate release tablet   0  . oxyCODONE-acetaminophen (PERCOCET) 10-325 MG per tablet Take 1 tablet by mouth every 4 (four) hours as needed for pain (pain).     . potassium chloride (MICRO-K) 10 MEQ CR capsule Take 10 mEq by mouth daily.  1   No current facility-administered medications for this encounter.     REVIEW OF SYSTEMS:  Pertinent items are noted in HPI.    PHYSICAL EXAM: Alert and oriented 59 year old white female appearing much older than her stated age. Wt Readings from Last 3 Encounters:  12/27/14 182 lb 1.6 oz (82.6 kg)  12/08/14 188 lb 12.8 oz (85.639 kg)  11/19/14 170 lb (77.111 kg)   Temp Readings from Last 3 Encounters:  12/27/14 97.9 F (36.6 C) Oral  12/18/14 98.6 F (37 C) Oral  11/21/14 97.7 F (36.5 C) Axillary   BP Readings from Last 3 Encounters:  12/27/14 143/64  12/18/14 135/72  12/08/14 117/77   Pulse Readings from Last 3 Encounters:  12/27/14 81  12/18/14 95  12/08/14 80       Head and neck examination: Grossly unremarkable.  Nodes: Without palpable cervical, supraclavicular, or axillary lymphadenopathy.  Chest: Lungs clear.  Breasts: There is a biopsy wound with ecchymosis along the inferior aspect of the left breast.  No discreet masses are appreciated.  Right breast without masses or lesions.  Extremities: There is no upper extremity lymphedema.  There is 3+ lower extremity lymphedema.  She needs assistance to ambulate.    LABORATORY DATA:  Lab Results  Component Value Date   WBC 8.7 12/27/2014   HGB 11.1* 12/27/2014   HCT 34.6* 12/27/2014   MCV 83.3 12/27/2014   PLT 402* 12/27/2014   Lab Results  Component Value Date   NA 133* 12/27/2014   K 3.6 12/27/2014   CL 96 12/18/2014   CO2 25 12/27/2014   Lab Results  Component Value Date   ALT 15 12/27/2014    AST 17 12/27/2014   ALKPHOS 119 12/27/2014   BILITOT 0.35 12/27/2014   CMP     Component Value Date/Time   NA 133* 12/27/2014 0820   NA 131* 12/18/2014 1716   K 3.6 12/27/2014 0820   K 3.7 12/18/2014 1716   CL 96 12/18/2014 1716   CO2 25 12/27/2014 0820   CO2 26 12/18/2014 1716   GLUCOSE 109 12/27/2014 0820   GLUCOSE 112* 12/18/2014 1716   BUN 20.2 12/27/2014 0820   BUN 21 12/18/2014 1716   CREATININE 0.8 12/27/2014 0820   CREATININE 0.79 12/18/2014 1716   CALCIUM 8.2* 12/27/2014 0820   CALCIUM 8.2* 12/18/2014 1716   PROT 5.0* 12/27/2014 0820   PROT 5.4* 12/08/2014 1232  PROT 5.8* 11/21/2014 0449   ALBUMIN 2.5* 12/27/2014 0820   ALBUMIN 3.1* 11/21/2014 0449   AST 17 12/27/2014 0820   AST 16 11/21/2014 0449   ALT 15 12/27/2014 0820   ALT 12 11/21/2014 0449   ALKPHOS 119 12/27/2014 0820   ALKPHOS 91 11/21/2014 0449   BILITOT 0.35 12/27/2014 0820   BILITOT 0.3 11/21/2014 0449   GFRNONAA >90 12/18/2014 1716   GFRAA >90 12/18/2014 1716      IMPRESSION: Stage I a (T1a 0 M0) invasive mammary carcinoma of the left breast.  She obviously has other medical issues that need to be further defined before we proceed with definitive therapy for her breast cancer.  She tells me she is scheduled for a brain MR in the near future.  We discussed management options for early breast cancer which include mastectomy or partial mastectomy followed by radiation therapy.  We also discussed hypofractionated radiation therapy over 3 one half weeks and also deep inspiration breath-hold technology to avoid cardiac irradiation.  We discussed the potential acute and late toxicities of radiation therapy.  Once her current medical evaluation has been completed, Dr. Dalbert Batman can decide on whether not to proceed with surgery or simply offer her antiestrogen therapy alone.  PLAN: As discussed above.  I spent 30 minutes minutes face to face with the patient and more than 50% of that time was spent in  counseling and/or coordination of care.

## 2014-12-27 NOTE — Progress Notes (Signed)
Clinical Social Work [place] Psychosocial Distress Screening [place]  Patient completed distress screening protocol and scored a 7 on the Psychosocial Distress Thermometer which indicates moderate distress. Clinical Social Worker met with patient and patients significant other in [facility] to assess for distress and other psychosocial needs.  Patient expressed multiple stressors and concerns for health issues not related to her new cancer diagnosis.  Patient expressed feeling overwhelmed by the number of medical problems she has experienced in the last year, and stated "i just want to feel better".  Patient and patients significant other stated they felt "much better" after meeting with the multidisciplinary team, and felt that "they were finally getting some answers".  CSW validated patients feelings and discussed the importance of support and common emotional responses to being diagnosed with cancer.  CSW informed patient of the support team and support services at [facility]. CSW encouraged patient to call as questions or concerns arise.     ONCBCN DISTRESS SCREENING 12/27/2014  Screening Type Initial Screening  Distress experienced in past week (1-10) 7  Family Problem type Partner  Emotional problem type Depression;Nervousness/Anxiety;Adjusting to illness  Spiritual/Religous concerns type Loss of sense of purpose  Information Concerns Type Lack of info about diagnosis  Physical Problem type Pain;Nausea/vomiting;Getting around;Bathing/dressing;Loss of appetitie;Talking;Tingling hands/feet;Skin dry/itchy;Swollen arms/legs  Physician notified of physical symptoms Yes  Referral to clinical psychology No  Referral to clinical social work Yes  Referral to support programs Yes  Referral to palliative care No   Johnnye Lana, MSW, LCSW, OSW-C Clinical Social Worker [place] [phone]

## 2014-12-27 NOTE — Assessment & Plan Note (Addendum)
Left breast invasive mammary cancer most likely ductal phenotype with memory carcinoma in situ, grade 3, ER/PR positive, HER-2 negative, ratio 1.27, Ki-67 60%  Pathology and radiology counseling:Discussed with the patient, the details of pathology including the type of breast cancer,the clinical staging, the significance of ER, PR and HER-2/neu receptors and the implications for treatment. After reviewing the pathology in detail, we proceeded to discuss the different treatment options between surgery, radiation, and antiestrogen therapies.  Protein malnutrition: Patient's general health has been compromised by years of poor nutrition and protein calorie malnutrition causing profound anasarca in the legs. Patient has lost 100 pounds since summer of 2015 and she primarily only eats lettuce leaves. She does not eat any protein at all. I will make a referral to [facility] nutrition specialist to assist her nutritionally to improve her protein levels.  Recommendation: 1. MRI breast to be done on 01/01/2015 2. Breast conserving surgery would be appropriate except for her poor general condition. We felt that it would be ideal to improve her general nutrition before surgery. In the interim I recommended starting antiestrogen therapy with anastrozole 1 mg daily. I discussed the risks and benefits of anastrozole including the risk of hot flashes, myalgias, risk of DVT and osteoporosis. I would like to obtain a bone density test to get a baseline evaluation. 3. Once her nutrition improves she can get breast conserving surgery followed by Radiation therapy followed by 4. Continuation of anastrozole as Anti-estrogen therapy.  Return to clinic in 6 weeks for follow-up on antiestrogen therapy.

## 2014-12-27 NOTE — Progress Notes (Signed)
[place] NOTE  Patient Care Team: Gavin Pound, MD as PCP - General (Family Medicine) Fanny Skates, MD as Consulting Physician (General Surgery) Rulon Eisenmenger, MD as Consulting Physician (Hematology and Oncology) Rexene Edison, MD as Consulting Physician (Radiation Oncology) [facility], RN as Registered Nurse Roselee Culver, RN as Registered Nurse  CHIEF COMPLAINTS/PURPOSE OF CONSULTATION:  Newly diagnosed breast cancer  HISTORY OF PRESENTING ILLNESS:  Jacqueline Moyer 59 y.o. female is here because of recent diagnosis of left breast cancer. Patient had a routine screening mammogram that showed left breast asymmetry with irregular small mass measuring 4 mm at 6:00 position. She had a previous bilateral breast reduction surgery 15 years ago. She also has a history of colon cancer in 2003 apparently stage I underwent hemicolectomy. Biopsy of the breast mass revealed a grade 3 invasive mammary cancer most likely ductal along with DCIS. ER/PR were positive HER-2 was negative with a Ki-67 of 60%. She was presented this morning to the multidisciplinary tumor board and she is here today to discuss a treatment plan accompanied by her husband.  Over the past 6 months patient's health has been generally declining with worsening leg edema. She also has diffuse aches and pains and was referred to see rheumatologist as well as neurologist. They have been working her up and trying to find out the cause of her symptoms. In the interim she was put on Lasix which apparently causes severe hypokalemia and required hospitalization with potassium replacement.  Upon further interview patient reports that she has lost 100 pounds over the past 6 months and that she has not been eating much food. She primarily is obsessed about weight issues and eats primarily lettuce. She does not eat any protein. She does not use any nutritional supplements. Previously she had a gastric  bypass surgery. She is in a wheelchair having excruciating pain in her legs especially at the groin she has profound leg swelling with redness.  I reviewed her records extensively and collaborated the history with the patient.  SUMMARY OF ONCOLOGIC HISTORY:   Breast cancer of lower-outer quadrant of left female breast   12/20/2014 Initial Diagnosis Left breast biopsy 6:00: Invasive mammary cancer probably ductal with mammary carcinoma in situ with apocrine features, grade 3, ER/PR positive, HER-2 negative ratio 1.27, Ki-67 60%   12/20/2014 Mammogram Left breast asymmetry: Irregular small mass 4 mm at 6:00 position    In terms of breast cancer risk profile:  She menarched at early age of 70 and had hysterectomy  She had 0 pregnancies  She has not received birth control pills She was never exposed to fertility medications or hormone replacement therapy.  She has  family history of Breast/GYN/GI cancer Father died at age 54 had colon cancer prostate cancer skin cancer, bladder and kidney problems Brother age 36 of skin cancer and prostate cancer  MEDICAL HISTORY:  Past Medical History  Diagnosis Date  . Hypertension   . Thyroid disease     hyperthyroidism  . Chronic back pain   . Chronic neck pain   . Depression   . COPD (chronic obstructive pulmonary disease)   . Anemia   . Cancer approx 2006    colon cancer  . Blood transfusion without reported diagnosis   . GERD (gastroesophageal reflux disease)   . Arthritis     SURGICAL HISTORY: Past Surgical History  Procedure Laterality Date  . Cholecystectomy    . Tonsillectomy    . Abdominal hysterectomy    .  Hernia repair    . Colon surgery  approx. 2006    for colon cancer  . Gastric bypass  2000    SOCIAL HISTORY: History   Social History  . Marital Status: Divorced    Spouse Name: Jacqueline Moyer     Number of Children: 1  . Years of Education: 12+   Occupational History  . Disabled     Social History Main Topics   . Smoking status: Former Smoker -- 1.00 packs/day for 40 years    Types: Cigarettes    Quit date: 12/27/2013  . Smokeless tobacco: Current User    Last Attempt to Quit: 04/04/2013  . Alcohol Use: No  . Drug Use: No  . Sexual Activity: No   Other Topics Concern  . Not on file   Social History Narrative   Patient lives at home with life partner Jacqueline Moyer    Patient has 1 child    Patient is disabled    Patient has a BS degree       FAMILY HISTORY: Family History  Problem Relation Age of Onset  . Colon cancer Father 50  . Prostate cancer Brother     ALLERGIES:  has No Known Allergies.  MEDICATIONS:  Current Outpatient Prescriptions  Medication Sig Dispense Refill  . fentaNYL (DURAGESIC - DOSED MCG/HR) 50 MCG/HR Place 50 mcg onto the skin every 3 (three) days.   0  . furosemide (LASIX) 40 MG tablet Take 1 tablet by mouth daily.   0  . levothyroxine (SYNTHROID, LEVOTHROID) 150 MCG tablet Take 150 mcg by mouth daily before breakfast.   2  . omeprazole (PRILOSEC) 20 MG capsule Take 20 mg by mouth daily.    . ondansetron (ZOFRAN) 4 MG tablet Take 1 tablet (4 mg total) by mouth every 8 (eight) hours as needed for nausea or vomiting (1 pill by mouth as needed for nausea/vomiting x1, then repeat x1 the day before the procedure and also the day of the procedure with prep x1). 4 tablet 0  . oxyCODONE (ROXICODONE) 15 MG immediate release tablet   0  . oxyCODONE-acetaminophen (PERCOCET) 10-325 MG per tablet Take 1 tablet by mouth every 4 (four) hours as needed for pain (pain).     . potassium chloride (MICRO-K) 10 MEQ CR capsule Take 10 mEq by mouth daily.  1  . anastrozole (ARIMIDEX) 1 MG tablet Take 1 tablet (1 mg total) by mouth daily. 90 tablet 3   No current facility-administered medications for this visit.    REVIEW OF SYSTEMS:   Constitutional: Denies fevers, chills or abnormal night sweats Eyes: Denies blurriness of vision, double vision or watery eyes Ears, nose,  mouth, throat, and face: Denies mucositis or sore throat Respiratory: Denies cough, dyspnea or wheezes Cardiovascular: Denies palpitation, chest discomfort; profound lower extremity swelling Gastrointestinal:  Denies nausea, heartburn or change in bowel habits Skin: Redness swelling irritation of her legs Lymphatics: Denies new lymphadenopathy or easy bruising Neurological: Profound generalized weakness weaknesses Behavioral/Psych: Mood is stable, no new changes  Breast:  Denies any palpable lumps or discharge All other systems were reviewed with the patient and are negative.  PHYSICAL EXAMINATION: ECOG PERFORMANCE STATUS: 3 - Symptomatic, >50% confined to bed  Filed Vitals:   12/27/14 0928  BP: 143/64  Pulse: 81  Temp: 97.9 F (36.6 C)  Resp: 18   Filed Weights   12/27/14 0928  Weight: 182 lb 1.6 oz (82.6 kg)    GENERAL:alert, no distress and comfortable SKIN:   skin color, texture, turgor are normal, no rashes or significant lesions EYES: normal, conjunctiva are pink and non-injected, sclera clear OROPHARYNX:no exudate, no erythema and lips, buccal mucosa, and tongue normal  NECK: supple, thyroid normal size, non-tender, without nodularity LYMPH:  no palpable lymphadenopathy in the cervical, axillary or inguinal LUNGS: clear to auscultation and percussion with normal breathing effort HEART: regular rate & rhythm and no murmurs and 4+ lower extremity edema with redness ABDOMEN:abdomen soft, non-tender and normal bowel sounds Musculoskeletal:no cyanosis of digits and no clubbing  PSYCH: alert & oriented x 3 with fluent speech NEURO: no focal motor/sensory deficits BREAST: No palpable nodules in breast. Bruising noted in the left breast at the site of the biopsy No palpable axillary or supraclavicular lymphadenopathy (exam performed in the presence of a chaperone)   LABORATORY DATA:  I have reviewed the data as listed Lab Results  Component Value Date   WBC 8.7 12/27/2014    HGB 11.1* 12/27/2014   HCT 34.6* 12/27/2014   MCV 83.3 12/27/2014   PLT 402* 12/27/2014   Lab Results  Component Value Date   NA 133* 12/27/2014   K 3.6 12/27/2014   CL 96 12/18/2014   CO2 25 12/27/2014    RADIOGRAPHIC STUDIES: I have personally reviewed the radiological reports and agreed with the findings in the report. Results are summarized as above  ASSESSMENT AND PLAN:  Breast cancer of lower-outer quadrant of left female breast Left breast invasive mammary cancer most likely ductal phenotype with memory carcinoma in situ, grade 3, ER/PR positive, HER-2 negative, ratio 1.27, Ki-67 60%  Pathology and radiology counseling:Discussed with the patient, the details of pathology including the type of breast cancer,the clinical staging, the significance of ER, PR and HER-2/neu receptors and the implications for treatment. After reviewing the pathology in detail, we proceeded to discuss the different treatment options between surgery, radiation, and antiestrogen therapies.  Protein malnutrition: Patient's general health has been compromised by years of poor nutrition and protein calorie malnutrition causing profound anasarca in the legs. Patient has lost 100 pounds since summer of 2015 and she primarily only eats lettuce leaves. She does not eat any protein at all. I will make a referral to [facility] nutrition specialist to assist her nutritionally to improve her protein levels.  Recommendation: 1. MRI breast to be done on 01/01/2015 2. Breast conserving surgery would be appropriate except for her poor general condition. We felt that it would be ideal to improve her general nutrition before surgery. In the interim I recommended starting antiestrogen therapy with anastrozole 1 mg daily. I discussed the risks and benefits of anastrozole including the risk of hot flashes, myalgias, risk of DVT and osteoporosis. I would like to obtain a bone density test to get a baseline evaluation. 3. Once  her nutrition improves she can get breast conserving surgery followed by Radiation therapy followed by 4. Continuation of anastrozole as Anti-estrogen therapy.  Return to clinic in 6 weeks for follow-up on antiestrogen therapy.    All questions were answered. The patient knows to call the clinic with any problems, questions or concerns.    ,  K, MD 11:49 AM    

## 2014-12-27 NOTE — Progress Notes (Signed)
Checked in new pt with no financial concerns prior to seeing the dr.  Informed pt to call Raquel if assistance is needed for chemo after insurance pays because she can contact different foundations that offer copay assistance.  She has Raquel's card for billing questions or concerns.

## 2014-12-27 NOTE — Progress Notes (Signed)
Subjective:     Patient ID: Jacqueline Moyer, female   DOB: 07/15/1956, 59 y.o.   MRN: [id number]  HPI   Review of Systems     Objective:   Physical Exam  For the patient to increase her caloric and protein intake.    Assessment:     Patient had a gastric bypass approximately 15 years ago, a breast reduction, and she has had colon cancer. Patient's boyfriend was present and very concerned about her current nutritional status. Patient was not forthcoming with information on her current diet but as time went on she gave some information. Her boyfriend Jacqueline Moyer) gave most of the information while the patient winced in pain from her lower leg bilateral edema and her neck pain (she was wearing a brace). Patient's boyfriend states she only eats lettuce, non-starchy vegetables, and vinegar in miniscule portions. Patient states she lost 200 pounds after the gastric bypass and then gained 100 pounds of it back. She has lost 100 pounds since June (pateint's boyfriend states). As the dietitian probed for what kind of food she was willing to eat she said it made her sick and throw up. On 12/27/1014 she is 182 pounds from 188 pounds on 12/08/2014 with a majority of this weight most likely being fluid. She also presents with some temporal wasting. Patient may be protein-energy malnourished. Labs: Na low at 133, Ca low at 8.2, GFR low at 80, HGB low at 11.1, and HCT low at 34.6. She has also had her gallbladder removed. Dietitian educated the patient on the importance of feeding her body in order to be at optimal health. This dietitian attempted to find foods she was willing to reintroduce into her diet with one introduction a week. Patient was willing to drink smoothies so dietitian gave her a few smoothie recipes with peanut butter but she is not willing to consume protein powder. This dietitian wrote down high protein foods she may be willing to try because all meats make her sick.    She is also on levothyroxine  for a thyroid disorder.  Dietitian suggested the physician refer her to Jacqueline Hoyle MS, RD, LDN at the [facility]. The patient seemed she may be compliant with going and meeting with Jacqueline Moyer.    Plan:     For the patient to increase her caloric and protein intake. The cancer dietitians contact information was also given in case she wants to follow up with specific nutrition and cancer related questions.

## 2014-12-27 NOTE — Patient Instructions (Signed)

## 2014-12-27 NOTE — Therapy (Signed)
[place], [place], [address] Phone: [phone]   Fax:  [phone]  Physical Therapy Evaluation  Patient Details  Name: Chapel Silverthorn MRN: [id number] Date of Birth: 1956/11/01 Referring Provider:  Fanny Skates, MD  Encounter Date: 12/27/2014      PT End of Session - 12/27/14 1134    Visit Number 1   Number of Visits 1   PT Start Time 0930   PT Stop Time 1000   PT Time Calculation (min) 30 min   Activity Tolerance Patient limited by fatigue;Treatment limited secondary to medical complications (Comment);Patient limited by pain   Behavior During Therapy [facility] for tasks assessed/performed      Past Medical History  Diagnosis Date  . Hypertension   . Thyroid disease     hyperthyroidism  . Chronic back pain   . Chronic neck pain   . Depression   . COPD (chronic obstructive pulmonary disease)   . Anemia   . Cancer approx 2006    colon cancer  . Blood transfusion without reported diagnosis   . GERD (gastroesophageal reflux disease)   . Arthritis     Past Surgical History  Procedure Laterality Date  . Cholecystectomy    . Tonsillectomy    . Abdominal hysterectomy    . Hernia repair    . Colon surgery  approx. 2006    for colon cancer  . Gastric bypass  2000    There were no vitals taken for this visit.  Visit Diagnosis:  Breast cancer, left - Plan: PT plan of care cert/re-cert  Generalized weakness - Plan: PT plan of care cert/re-cert  Difficulty walking - Plan: PT plan of care cert/re-cert  Decreased right shoulder range of motion - Plan: PT plan of care cert/re-cert  Decreased ROM of left shoulder - Plan: PT plan of care cert/re-cert  Abnormal posture - Plan: PT plan of care cert/re-cert  Leg swelling - Plan: PT plan of care cert/re-cert      Subjective Assessment - 12/27/14 1028    Symptoms Patient is here today for an initial assessment of her left breast cancer.    Pertinent History Diagnosed 12/04/14 with left ER/PR positive, HER2 negative breast cancer at the 6 o'clock position.  Mass is 4 mm in size.   Patient Stated Goals Reduce lymphedema risk and learn post op shoulder ROM exercises.   Currently in Pain? Yes   Pain Score 10-Worst pain ever   Pain Location --  back, legs, arms, neck, "all over"   Pain Orientation Right;Left   Pain Descriptors / Indicators Constant   Pain Onset More than a month ago   Pain Frequency Constant   Aggravating Factors  sitting in 1 position   Pain Relieving Factors moving; changing positions   Multiple Pain Sites Yes  see above; "pain all over"          [facility] PT Assessment - 12/27/14 0001    Assessment   Medical Diagnosis Left breast cancer   Onset Date 12/04/14   Precautions   Precautions Fall  Active cancer; malnourished   Restrictions   Weight Bearing Restrictions No   Balance Screen   Has the patient fallen in the past 6 months Yes   How many times? Many (number unknown)   Has the patient had a decrease in activity level because of a fear of falling?  Yes   Is the patient reluctant to leave their home because of a fear of falling?  Yes  Patient currently undergoing P.T. at [place] residence   Living Arrangements Spouse/significant other  Boyfriend   Available Help at Discharge Family   Prior Function   Level of Independence Requires assistive device for independence;Needs assistance with ADLs;Needs assistance with homemaking;Needs assistance with gait;Needs assistance with transfers   Vocation On disability  Was previously RN in ED   Leisure Unable to participate in any exercise   Cognition   Overall Cognitive Status Within Functional Limits for tasks assessed   Functional Tests   Functional tests Sit to Stand  Unable to complete sit to stand without mod assist   Posture/Postural Control   Posture/Postural Control Postural limitations    Postural Limitations Rounded Shoulders;Forward head;Increased thoracic kyphosis   AROM   Right Shoulder Extension 41 Degrees   Right Shoulder Flexion 114 Degrees   Right Shoulder ABduction 96 Degrees   Right Shoulder Internal Rotation 50 Degrees   Right Shoulder External Rotation 70 Degrees   Left Shoulder Extension 60 Degrees   Left Shoulder Flexion 103 Degrees   Left Shoulder ABduction 96 Degrees   Left Shoulder Internal Rotation 62 Degrees   Left Shoulder External Rotation 75 Degrees   Strength   Overall Strength Unable to assess  Generalized muscle weakness           LYMPHEDEMA/ONCOLOGY QUESTIONNAIRE - 12/27/14 1130    Type   Cancer Type Left breast   Lymphedema Assessments   Lymphedema Assessments Upper extremities   Right Upper Extremity Lymphedema   10 cm Proximal to Olecranon Process 26.5 cm   Olecranon Process 22.7 cm   10 cm Proximal to Ulnar Styloid Process 19.3 cm   Just Proximal to Ulnar Styloid Process 15.3 cm   Across Hand at [employer] 18.7 cm   At [place] of 2nd Digit 6.7 cm   Left Upper Extremity Lymphedema   10 cm Proximal to Olecranon Process 27.2 cm   Olecranon Process 23.3 cm   10 cm Proximal to Ulnar Styloid Process 19.6 cm   Just Proximal to Ulnar Styloid Process 15.5 cm   Across Hand at [employer] 18.1 cm   At [place] of 2nd Digit 6.4 cm           PT Education - 12/27/14 1134    Education provided Yes   Education Details HEP for post op shoulder ROM; lymphedema risk reduction   Person(s) Educated Patient;Caregiver(s)   Methods Explanation;Demonstration;Handout   Comprehension Verbalized understanding          Breast Clinic Goals - 12/27/14 1147    Patient will be able to verbalize understanding of pertinent lymphedema risk reduction practices relevant to her diagnosis specifically related to skin care.   Time 1   Period Days   Status Achieved   Patient will be able to return demonstrate and/or verbalize understanding of the  post-op home exercise program related to regaining shoulder range of motion.   Time 1   Period Days   Status Achieved   Patient will be able to verbalize understanding of the importance of attending the postoperative After Breast Cancer Class for further lymphedema risk reduction education and therapeutic exercise.   Time 1   Period Days   Status Achieved              Plan - 12/27/14 1137    Clinical Impression Statement Patient was seen today for her new diagnosis of left breast cancer.  She has been losing weight since 6/15 due to reportedly eating lettuce and vinegar and has lost 100 pounds.  For the past 3 months, she has lost strength, has increased pain, and has significant swelling in bilateral lower extremities.  She is currently undergoing P.T. for weakness.  She plans to undergo neoadjuvant hormone therapy followed by a left lumpectomy and sentinel node biopsy and radiation therapy.  She will benefit from continued P.T. for weakness and will benefit from P.T. after breast surgery to regain shoulder ROM and prevent arm lymphedema.   Pt will benefit from skilled therapeutic intervention in order to improve on the following deficits Decreased range of motion;Difficulty walking;Increased edema;Decreased knowledge of precautions;Decreased strength;Impaired UE functional use;Pain   Rehab Potential Fair   Clinical Impairments Affecting Rehab Potential Generalized weakness and overall decline due to poor nutrition   PT Frequency One time visit  but will continue previous PT at [facility] clinic for weakness.   PT Treatment/Interventions Patient/family education;Therapeutic exercise   Consulted and Agree with Plan of Care Patient;Family member/caregiver   Family Member Consulted Boyfriend    Patient will follow up at outpatient cancer rehab if needed following surgery.  If the patient requires physical therapy at that time, a specific plan will be dictated and sent to the referring  physician for approval. The patient was educated today on appropriate basic range of motion exercises to begin post operatively and the importance of attending the After Breast Cancer class following surgery.  Patient was educated today on lymphedema risk reduction practices as it pertains to recommendations that will benefit the patient immediately following surgery.  She verbalized good understanding.  No additional physical therapy is indicated at this time.         G-Codes - 12/30/2014 1147    Functional Assessment Tool Used Clinical Judgement   Functional Limitation Other PT primary   Other PT Primary Current Status (Y1856) At least 80 percent but less than 100 percent impaired, limited or restricted   Other PT Primary Goal Status (D1497) At least 80 percent but less than 100 percent impaired, limited or restricted   Other PT Primary Discharge Status [phone]) At least 80 percent but less than 100 percent impaired, limited or restricted       Problem List Patient Active Problem List   Diagnosis Date Noted  . Breast cancer of lower-outer quadrant of left female breast 12/22/2014  . Proximal muscle weakness 12/09/2014  . Protein-calorie malnutrition, severe 11/21/2014  . Hypokalemia 11/19/2014  . Benign essential HTN 11/19/2014  . UTI (lower urinary tract infection) 11/19/2014  . Abdominal pain 11/19/2014  . GERD 08/29/2008  . History of malignant neoplasm of large intestine 08/29/2008    Annia Friendly, PT 12/30/2014, 11:51 AM  [place], [place], [address] Phone: [phone]   Fax:  [phone]

## 2014-12-27 NOTE — Progress Notes (Signed)
Quick Note:  Called and left message for patient Lyme test was normal. ______

## 2014-12-28 ENCOUNTER — Ambulatory Visit: Payer: Medicare Other | Admitting: Physical Therapy

## 2014-12-28 DIAGNOSIS — M6281 Muscle weakness (generalized): Secondary | ICD-10-CM | POA: Diagnosis not present

## 2014-12-29 ENCOUNTER — Telehealth: Payer: Self-pay | Admitting: Neurology

## 2014-12-29 NOTE — Telephone Encounter (Signed)
Patient stated she's scheduled for MRI on 01/01/15 and requesting medication for Claustrophobia faxed to [facility] on [address].  Please call and advise.

## 2014-12-29 NOTE — Telephone Encounter (Signed)
-----   Message from Melvenia Beam, MD sent at 12/25/2014  5:07 PM EST ----- Please let patient know her lyme test was negative. Thank you

## 2014-12-30 ENCOUNTER — Other Ambulatory Visit: Payer: Self-pay | Admitting: Neurology

## 2014-12-30 MED ORDER — ALPRAZOLAM 0.5 MG PO TABS: ORAL_TABLET | ORAL | Status: DC

## 2014-12-30 NOTE — Telephone Encounter (Signed)
Prescription sent in and called and spoke with patient to let her know.

## 2015-01-01 ENCOUNTER — Inpatient Hospital Stay: Admission: RE | Admit: 2015-01-01 | Payer: Medicare Other | Source: Ambulatory Visit

## 2015-01-01 ENCOUNTER — Other Ambulatory Visit: Payer: Medicare Other

## 2015-01-01 ENCOUNTER — Ambulatory Visit
Admission: RE | Admit: 2015-01-01 | Discharge: 2015-01-01 | Disposition: A | Discharge status: Home / Self Care | Payer: Medicare Other | Source: Ambulatory Visit | Attending: Neurology | Admitting: Neurology

## 2015-01-01 DIAGNOSIS — R1310 Dysphagia, unspecified: Secondary | ICD-10-CM

## 2015-01-01 DIAGNOSIS — R531 Weakness: Secondary | ICD-10-CM

## 2015-01-01 MED ORDER — GADOBENATE DIMEGLUMINE 529 MG/ML IV SOLN
17.0000 mL | Freq: Once | INTRAVENOUS | Status: AC | PRN
  Administered 2015-01-01: 17 mL via INTRAVENOUS

## 2015-01-02 ENCOUNTER — Encounter: Payer: Medicare Other | Attending: Hematology and Oncology | Admitting: *Deleted

## 2015-01-02 ENCOUNTER — Encounter: Payer: Self-pay | Admitting: *Deleted

## 2015-01-02 ENCOUNTER — Ambulatory Visit: Payer: Medicare Other | Admitting: Physical Therapy

## 2015-01-02 VITALS — Ht 65.5 in | Wt 183.5 lb

## 2015-01-02 DIAGNOSIS — Z713 Dietary counseling and surveillance: Secondary | ICD-10-CM | POA: Diagnosis not present

## 2015-01-02 DIAGNOSIS — E43 Unspecified severe protein-calorie malnutrition: Secondary | ICD-10-CM | POA: Diagnosis present

## 2015-01-02 DIAGNOSIS — M6281 Muscle weakness (generalized): Secondary | ICD-10-CM | POA: Diagnosis not present

## 2015-01-02 NOTE — Telephone Encounter (Signed)
-----   Message from Melvenia Beam, MD sent at 12/25/2014  5:07 PM EST ----- Please let patient know her lyme test was negative. Thank you

## 2015-01-02 NOTE — Patient Instructions (Addendum)
Aim for 3 "meals" and 3 snacks Each meal needs some kind of protein, fat, and starch  1 starch, 2-3 proteins, 1 fat Each snack 1 starch or 1 fruit or 1 milk and 1 protein and 1 protein (use snack handout for ideas)  Or use carnation breakfast essentials mixed with whole milk

## 2015-01-02 NOTE — Progress Notes (Signed)
Medical Nutrition Therapy:  Appt start time: 1500 end time:  1600.   Primary care MD: Dr. Marcelyn Ditty currently.  Will transition to new provider when this provider moves Therapist: has appointment next week Any other medical team members: Dr. Lindi Adie, oncology  Assessment Weight 183.5 lb Height 65.5 in Expected body weight: 125 +12 lb Percent expected body weight: 163%   Primary concerns today: Jacqueline Moyer is here upon referral from [facility] for concerns of malnutrition.  She reports not being able to function  She reports initially trying to lose weight and she was eating only lettuce.  She has been referred to psychiatry. She has a history of eating disorder and history of gastric bypass surgery 15 years ago.  Per medical record, Patient has lost 100 pounds since summer of 2015 and she primarily only eats lettuce leaves. She does not eat any protein at all.  This past summer she started having  Problems with her stomahc :diarrhea, pain, etc.  She started eating less to help and then she started losing weight and that was motivating for her.   She was vomiting a lot, she says not intentionally, but she hasn't done antying since alst week she she got hope Her mother has been very negative and body discouraging.  There is also a history of family dysfunction with her ex husband and her daughter  Reports bulemia in her early 73s and she reports switching back and forth between restriting and purging. .  She reports "all or nothing mentality." She did nto receive any professional help with this.  These struggles subsided after her bypass surgery.  She weighed 350 prior to surgery and lost 200 pounds in about a year.  She maintain 150-160 until about 8 years ago, then she started gaining weight.  She was drinking more (and was a very heavy drinker).  She does not drink any more (quit 2 years ago) her weight gain stopped around that time, but she had gained ~100 pounds in 6 years.  HEr husband was gone   Most of the summer so he didn't notice until this past fall when he came back into town  Weight history:  Highest weight: 350 lb   Lowest weight: 160 lb Most consistent weight: varies  What would you like to weigh:160 lb How has weight changed in the past year: lost ~100 pounds in 6 months  Medical Information:  Changes in hair, skin, nails since ED started: hair is 1/2 as thick as it used to be, skin is dry and broken out Chewing/swallowing difficulties: some swallowing difficulites.  Husband reports change in her voice in the last 3 weeks, today is a little better Relux or heartburn: yes, but that has been ongoing Trouble with teeth: just lost tooth yesterday.  Has dental problems in the past 2 years.  Needs extensive dental work LMP without the use of hormones: NA, s/p hysterectomy   Constipation, diarrhea: constipation, has BM every day, but it's difficult postiive for dizziness and lightheadedness postiive for cold intolerance Very poor energy Positive for personalilty chagnes. Positive for anasarca in legs; can baraely walk Positive for taste changes   Administered EAT-26 Score significant >20 Patient score: 15   TANITA  BODY COMP RESULTS  01/02/15   BMI (kg/m^2) 30.5   Fat Mass (lbs) 29.5   Fat Free Mass (lbs) 154   Total Body Water (lbs) 112.5  Body fat % 16.1    Dietary assessment:- has made major changes recently (past 2 weeks)  Prior to that she ate maybe 2 times a day: salad with vinegar and apple  Beverages consisted of water or unsweet tea  Currently she is trying to eat every couple hours- little meals  Avoided foods include:none currently  24 hour recall:  Protein shake ([facility] powder with 8 oz whole milk and frozen fruit) 3 tangarines 3 oz steak with baked potato (1/4), 3 bean salad (3 bites) 3 glasses unsweet tea Some cheese crackers   What Methods Do You Use To Control Your Weight (Compensatory behaviors)?- denies currently            Estimated  energy intake: 700 kcal  Estimated energy needs: 2000+ kcal 250 g CHO 100 g pro 67 g fat  Nutrition Diagnosis: NI-1.4 Inadequate energy intake As related to restriced dietary consumption.  As evidenced by 100 pound wiegh tloss in 6 months.  Intervention/Goals: Nutrition counseling provided.  Educated patient on symptoms of malnutrition: both physical and psychological.  She was relieved to learn that food is her new medicine and she can get well again by eating.   Meal plan:    3 meals    3 snacks Each meal needs some kind of protein, fat, and starch  1 starch, 2-3 proteins, 1 fat Each snack 1 starch or 1 fruit or 1 milk and 1 protein and 1 protein (use snack handout for ideas)  Or use carnation breakfast essentials mixed with whole milk  Handout given: My Meal plan card  Samples given: Carnation Breakfast Essentials- 3    Monitoring and Evaluation: Patient will follow up in 2 weeks.

## 2015-01-03 ENCOUNTER — Telehealth: Payer: Self-pay | Admitting: *Deleted

## 2015-01-03 ENCOUNTER — Inpatient Hospital Stay: Admission: RE | Admit: 2015-01-03 | Payer: Medicare Other | Source: Ambulatory Visit

## 2015-01-03 ENCOUNTER — Other Ambulatory Visit: Payer: Medicare Other

## 2015-01-03 NOTE — Telephone Encounter (Signed)
lvm instructing patient to please take MVI with calcium, vitamin d, and iron

## 2015-01-04 ENCOUNTER — Ambulatory Visit
Admission: RE | Admit: 2015-01-04 | Discharge: 2015-01-04 | Disposition: A | Discharge status: Home / Self Care | Payer: Medicare Other | Source: Ambulatory Visit | Attending: Radiology | Admitting: Radiology

## 2015-01-04 ENCOUNTER — Telehealth: Payer: Self-pay | Admitting: *Deleted

## 2015-01-04 ENCOUNTER — Ambulatory Visit: Payer: Medicare Other | Admitting: Physical Therapy

## 2015-01-04 ENCOUNTER — Telehealth: Payer: Self-pay | Admitting: Neurology

## 2015-01-04 DIAGNOSIS — C50912 Malignant neoplasm of unspecified site of left female breast: Secondary | ICD-10-CM

## 2015-01-04 DIAGNOSIS — M6281 Muscle weakness (generalized): Secondary | ICD-10-CM | POA: Diagnosis not present

## 2015-01-04 MED ORDER — GADOBENATE DIMEGLUMINE 529 MG/ML IV SOLN
17.0000 mL | Freq: Once | INTRAVENOUS | Status: AC | PRN
Start: 2015-01-04 — End: 2015-01-04
  Administered 2015-01-04: 17 mL via INTRAVENOUS

## 2015-01-04 NOTE — Telephone Encounter (Signed)
Talked with patient about normal MRI brain results and the results of MRI of cervical spine. Patient verbalized understanding. I advised patient to call back if she had any more questions.

## 2015-01-04 NOTE — Telephone Encounter (Signed)
Pt just calling to let you know that she did not schedule the swallowing test, she is not having the problems anymore.  Just wanted to you to be aware.

## 2015-01-09 ENCOUNTER — Ambulatory Visit: Payer: Medicare Other

## 2015-01-09 ENCOUNTER — Ambulatory Visit (INDEPENDENT_AMBULATORY_CARE_PROVIDER_SITE_OTHER): Payer: Medicare Other | Admitting: Psychiatry

## 2015-01-09 DIAGNOSIS — F509 Eating disorder, unspecified: Secondary | ICD-10-CM

## 2015-01-09 DIAGNOSIS — F0630 Mood disorder due to known physiological condition, unspecified: Secondary | ICD-10-CM

## 2015-01-09 NOTE — Telephone Encounter (Signed)
Dr. Jaynee Eagles verbalized that she relayed results of negative lyme test.

## 2015-01-11 ENCOUNTER — Telehealth: Payer: Self-pay | Admitting: *Deleted

## 2015-01-11 ENCOUNTER — Encounter: Payer: Medicare Other | Admitting: Physical Therapy

## 2015-01-11 ENCOUNTER — Encounter: Payer: Self-pay | Admitting: *Deleted

## 2015-01-11 NOTE — Telephone Encounter (Signed)
Spoke with patient from [facility] 12/27/14.  She states she would like to talk to a therapist now, and that she was not ready before but now she is.  Informed her I would give her information to one of the social workers who could connect her with the appropriate person.  She is aware of her appointments.  She is going back to Dr. Dalbert Batman next week on 2/23 for a follow up visit. Encouraged her to call with any needs or concerns.

## 2015-01-11 NOTE — Progress Notes (Addendum)
[place] Work    Pt open to referral to counseling interns. CSW called pt to discuss and agrees to [place] making referral on her behalf. She is aware to expect call from counseling interns. Pt not open to additional resources or support currently, but CSW discussed additional options.   Clinical Social Work interventions: Resource education Emotional support  Loren Racer, [facility] Worker [place]  [place] Phone: [phone] Fax: [phone]

## 2015-01-12 ENCOUNTER — Other Ambulatory Visit: Payer: Medicare Other

## 2015-01-15 ENCOUNTER — Encounter: Payer: Self-pay | Admitting: Physical Therapy

## 2015-01-15 ENCOUNTER — Ambulatory Visit: Payer: Medicare Other | Admitting: Physical Therapy

## 2015-01-15 DIAGNOSIS — R6889 Other general symptoms and signs: Secondary | ICD-10-CM

## 2015-01-15 DIAGNOSIS — M6281 Muscle weakness (generalized): Secondary | ICD-10-CM | POA: Diagnosis not present

## 2015-01-15 DIAGNOSIS — R531 Weakness: Secondary | ICD-10-CM

## 2015-01-15 DIAGNOSIS — Z7409 Other reduced mobility: Secondary | ICD-10-CM

## 2015-01-15 NOTE — Patient Instructions (Signed)
Body Positioning   Gently lean trunk forward until nose is over toes. Hold ____ seconds. Return. Repeat __3__ times. Do _1-2___ sessions per day.  http://gt2.exer.us/462   Copyright  VHI. All rights reserved.

## 2015-01-15 NOTE — Therapy (Addendum)
[facility] [address] [place], [place], [place], [address] Phone: [phone]   Fax:  [phone]  Physical Therapy Treatment  Patient Details  Name: Jacqueline Moyer MRN: [id number] Date of Birth: 1956/02/10 Referring Provider:  Gavin Pound, MD  Encounter Date: 01/15/2015      PT End of Session - 01/15/15 0844    Visit Number 2   Date for PT Re-Evaluation 01/30/15   PT Start Time 5277   PT Stop Time 0845   PT Time Calculation (min) 48 min   Activity Tolerance Patient limited by fatigue;Patient limited by pain   Behavior During Therapy [facility] for tasks assessed/performed      Past Medical History  Diagnosis Date  . Hypertension   . Thyroid disease     hyperthyroidism  . Chronic back pain   . Chronic neck pain   . Depression   . COPD (chronic obstructive pulmonary disease)   . Anemia   . Cancer approx 2006    colon cancer  . Blood transfusion without reported diagnosis   . GERD (gastroesophageal reflux disease)   . Arthritis     Past Surgical History  Procedure Laterality Date  . Cholecystectomy    . Tonsillectomy    . Abdominal hysterectomy    . Hernia repair    . Colon surgery  approx. 2006    for colon cancer  . Gastric bypass  2000    There were no vitals taken for this visit.  Visit Diagnosis:  Decreased functional activity tolerance  Decreased strength  Decreased mobility and endurance      Subjective Assessment - 01/15/15 0805    Symptoms I feel like i am going backwards rather than forwards   Currently in Pain? Yes   Pain Score 8    Pain Location Hip   Pain Orientation Right;Left   Pain Descriptors / Indicators Constant   Aggravating Factors  everything   Pain Relieving Factors medications, rest   Multiple Pain Sites No          [facility] PT Assessment - 01/15/15 0001    Assessment   Medical Diagnosis Muscle weakness   Precautions   Precautions --  No forward flexion, no joint  mobs   Balance Screen   Has the patient fallen in the past 6 months Yes   How many times? several   Has the patient had a decrease in activity level because of a fear of falling?  No   Is the patient reluctant to leave their home because of a fear of falling?  Yes   Observation/Other Assessments   Focus on Therapeutic Outcomes (FOTO)  99% limitation   Other Surveys  --  BERG 35/56, TUG 38 seconds without device   AROM   Right Knee Extension -35   Left Knee Extension -30   Strength   Overall Strength --  Bil hip strength: flexion 2/5, abduction2/5, Trunk is 3/5,    Special Tests    Special Tests --  Shoulder strength Bil 3+/5                  [facility] Adult PT Treatment/Exercise - 01/15/15 0001    Transfers   Transfers --  Sit to supine with moderate asst for RT LE then reverse.   Knee/Hip Exercises: Standing   Other Standing Knee Exercises sit to stand 3x cuing verbally to use legs moret than arms.    Knee/Hip Exercises: Seated   Long Arc Quad AAROM;1 set;10  reps  Verbal cues to keep head up   Other Seated Knee Exercises Adductor squeeze with ball 2x20   Other Seated Knee Exercises yellow band abduction and flexion 2x10   Shoulder Exercises: Seated   Flexion Strengthening;Both   Theraband Level (Shoulder Flexion) --  Alternating overhead flexion with elbows bent   Flexion Weight (lbs) 1# x25, 2#    Other Seated Exercises Bicep curls 2# 25x                PT Education - 01/15/15 0837    Education provided Yes   Education Details Sit to stand for HEP, how to elevate LE in bed using wedge   Person(s) Educated Patient   Methods Demonstration;Handout   Comprehension Verbalized understanding;Returned demonstration          PT Short Term Goals - 01/15/15 0847    PT SHORT TERM GOAL #1   Title  Independent in initial balance exercises   Time 4   Period Weeks   Status Achieved   PT SHORT TERM GOAL #2   Title Independent in initial chair exercises    Time 4   Period Weeks   Status Achieved   PT SHORT TERM GOAL #3   Title TUG </= 25 seconds   Time 4   Period Weeks   Status On-going  will work on later in week   PT [place] #4   Title BERG score ./= 40/56   Time 4   Status On-going  will test in next visit or so   PT SHORT TERM GOAL #5   Title Look into getting a walker to decrease her risk of falling   Time 4   Period Weeks   Status On-going  No comment today   Additional Short Term Goals   Additional Short Term Goals Yes   PT SHORT TERM GOAL #6   Title Move in bed with >/=25% greater ease due to increased overall strength   Time 4   Status --  Patient has not tried any bed mobility per her reports today.           PT Long Term Goals - 01/15/15 0854    PT LONG TERM GOAL #1   Title demonstrate knowledge of posture, body mechanics, lifting to reduce risk of re-injury.   Time 8   Period Weeks   Status On-going   PT LONG TERM GOAL #2   Title Independent in final HEP for trunk strengthening   Time 8   Period Weeks   Status On-going   PT LONG TERM GOAL #3   Title TUG </= 18 seconds   Time 8   Period Weeks   Status On-going   PT LONG TERM GOAL #4   Title BERG >/= 48/56   Time 8   Period Weeks   Status On-going   PT LONG TERM GOAL #5   Title Move in bed with >/= 50% greater ease due to improved strength.   Time 8   Period Weeks   Status On-going   Additional Long Term Goals   Additional Long Term Goals Yes   PT LONG TERM GOAL #6   Title Get in and out of chair with >/= 50% greater ease due to increased strength.   Time 8   Period Weeks   Status On-going  Patient worked on sit to stand today using her LE more,prorgressing towards goal.         Breast Clinic Goals -  12/27/14 1147    Patient will be able to verbalize understanding of pertinent lymphedema risk reduction practices relevant to her diagnosis specifically related to skin care.   Time 1   Period Days   Status Achieved   Patient  will be able to return demonstrate and/or verbalize understanding of the post-op home exercise program related to regaining shoulder range of motion.   Time 1   Period Days   Status Achieved   Patient will be able to verbalize understanding of the importance of attending the postoperative After Breast Cancer Class for further lymphedema risk reduction education and therapeutic exercise.   Time 1   Period Days   Status Achieved              Plan - 01/15/15 0845    Clinical Impression Statement Patient very discouraged, feels she is doing what she needs to do nutritionally an here in PT with little results. She worked very hard in therapy today, excellent effort.   Pt will benefit from skilled therapeutic intervention in order to improve on the following deficits Abnormal gait;Decreased activity tolerance;Decreased balance;Decreased mobility;Decreased endurance;Decreased range of motion;Difficulty walking;Increased edema;Decreased strength;Pain;Improper body mechanics        Problem List Patient Active Problem List   Diagnosis Date Noted  . Breast cancer of lower-outer quadrant of left female breast 12/22/2014  . Proximal muscle weakness 12/09/2014  . Protein-calorie malnutrition, severe 11/21/2014  . Hypokalemia 11/19/2014  . Benign essential HTN 11/19/2014  . UTI (lower urinary tract infection) 11/19/2014  . Abdominal pain 11/19/2014  . GERD 08/29/2008  . History of malignant neoplasm of large intestine 08/29/2008    Owens Hara, PTA 01/15/2015, 9:13 AM  PHYSICAL THERAPY DISCHARGE SUMMARY  Visits from Start of Care: 7  Current functional level related to goals / functional outcomes: Unknown as pt didn't return to PT.  Plan of care is now expired.  Remaining deficits: On last visit pt had continued weakness and endurance deficits.  Thank you for this referral.    Education / Equipment: HEP Plan: Patient agrees to discharge.  Patient goals were not met. Patient  is being discharged due to not returning since the last visit.  ?????   Sigurd Sos, [place] 02/08/2015 10:53 AM  [facility] [address] [place], [place], [place], [address] Phone: [phone]   Fax:  [phone]

## 2015-01-18 ENCOUNTER — Ambulatory Visit: Payer: Medicare Other | Admitting: *Deleted

## 2015-01-23 ENCOUNTER — Encounter: Payer: Medicare Other | Attending: Hematology and Oncology | Admitting: *Deleted

## 2015-01-23 VITALS — Wt 165.5 lb

## 2015-01-23 DIAGNOSIS — Z713 Dietary counseling and surveillance: Secondary | ICD-10-CM | POA: Diagnosis not present

## 2015-01-23 DIAGNOSIS — E639 Nutritional deficiency, unspecified: Secondary | ICD-10-CM

## 2015-01-23 DIAGNOSIS — E43 Unspecified severe protein-calorie malnutrition: Secondary | ICD-10-CM | POA: Insufficient documentation

## 2015-01-23 DIAGNOSIS — T730XXA Starvation, initial encounter: Secondary | ICD-10-CM

## 2015-01-23 NOTE — Patient Instructions (Signed)
Continue great work!! Give it time (3-6 months) Portions are 1/2 cup for vegetables and starches and size of pal of hand for proteins Aim for 3 meals and 2-3 snacks (with protein)

## 2015-01-23 NOTE — Progress Notes (Signed)
[facility] BODY COMP RESULTS  01/02/15 01/23/15  BMI (kg/m^2) 30.5 27.5  Fat Mass (lbs) 29.5 39.5  Fat Free Mass (lbs) 154 126.0  Total Body Water (lbs) 112.5 92  Body fat % 16.1 23.9         Assessment:  Jacqueline Moyer is here with her husband for follow up nutrition counseling pertaining to disordered eating.  She has lost weight since last visit, but it's water weight.  Her fat mass has actually increased.  She says eating is going "good.  Very good."  She is starting to feel better, but she is frustrated that things are moving as fast as she wants.  She wants to be all better now, but she has been told it will take several months to get her weight and strength back  24 hour recall B: protein powder with fruit and whole milk S: fruit L: country style steak, green beans, mashed potatoes. Slice [place] toast S: fruit: apples or orange D: mahi and green beans, with baked potato  Complains of Constipation. But doesn't allow herself to get too full; doesn't eat all her plate at once, but goes back for it later.  Her total portion is 1/2 the recommendation, though (only eating 1/4 cup starch and 1-2 oz meat)  Likes peanut butter and cheese with her snack  Estimated energy intake: 1000-1200 kcal Estimated energy needs: 1800-2000 kcal  Nutrition Diagnosis: NI-1.4 Inadequate energy intake As related to restriced dietary consumption. As evidenced by 100 pound weight loss in 6 months.   Intervention: suggested Miralax for constipation.  Suggested counselor for eating struggles so she doesn't relapse.  She will accept a referral; I need to find an eating disorder therapist who accepts her insurance.  Discussed appropriate portions: 1/2 cup starch and 3 oz meat.  Emphasized need for adequate protein, which she is trying to consume.  She's doing great.  It just takes time   Monitoring: 1 month

## 2015-01-25 ENCOUNTER — Telehealth: Payer: Self-pay | Admitting: *Deleted

## 2015-01-25 NOTE — Telephone Encounter (Signed)
lvm recommending Ed Blalock at [facility] for counseling

## 2015-01-28 DIAGNOSIS — R202 Paresthesia of skin: Secondary | ICD-10-CM | POA: Insufficient documentation

## 2015-01-28 DIAGNOSIS — G609 Hereditary and idiopathic neuropathy, unspecified: Secondary | ICD-10-CM | POA: Insufficient documentation

## 2015-01-28 DIAGNOSIS — R1310 Dysphagia, unspecified: Secondary | ICD-10-CM | POA: Insufficient documentation

## 2015-02-07 ENCOUNTER — Emergency Department (HOSPITAL_COMMUNITY): Payer: Medicare Other

## 2015-02-07 ENCOUNTER — Ambulatory Visit: Payer: Medicare Other | Admitting: Hematology and Oncology

## 2015-02-07 ENCOUNTER — Inpatient Hospital Stay (HOSPITAL_COMMUNITY)
Admission: EM | Admit: 2015-02-07 | Discharge: 2015-02-12 | DRG: 918 | Disposition: A | Discharge status: Other Acute Inpatient Hospital | Payer: Medicare Other | Attending: Internal Medicine | Admitting: Internal Medicine

## 2015-02-07 ENCOUNTER — Encounter (HOSPITAL_COMMUNITY): Payer: Self-pay

## 2015-02-07 ENCOUNTER — Inpatient Hospital Stay (HOSPITAL_COMMUNITY): Payer: Medicare Other

## 2015-02-07 DIAGNOSIS — Z9884 Bariatric surgery status: Secondary | ICD-10-CM

## 2015-02-07 DIAGNOSIS — M1990 Unspecified osteoarthritis, unspecified site: Secondary | ICD-10-CM | POA: Diagnosis present

## 2015-02-07 DIAGNOSIS — I10 Essential (primary) hypertension: Secondary | ICD-10-CM | POA: Diagnosis present

## 2015-02-07 DIAGNOSIS — Z79891 Long term (current) use of opiate analgesic: Secondary | ICD-10-CM | POA: Diagnosis not present

## 2015-02-07 DIAGNOSIS — Z79811 Long term (current) use of aromatase inhibitors: Secondary | ICD-10-CM | POA: Diagnosis not present

## 2015-02-07 DIAGNOSIS — L03116 Cellulitis of left lower limb: Secondary | ICD-10-CM | POA: Diagnosis present

## 2015-02-07 DIAGNOSIS — Z85038 Personal history of other malignant neoplasm of large intestine: Secondary | ICD-10-CM | POA: Diagnosis not present

## 2015-02-07 DIAGNOSIS — K219 Gastro-esophageal reflux disease without esophagitis: Secondary | ICD-10-CM | POA: Diagnosis present

## 2015-02-07 DIAGNOSIS — Z9049 Acquired absence of other specified parts of digestive tract: Secondary | ICD-10-CM | POA: Diagnosis present

## 2015-02-07 DIAGNOSIS — R45851 Suicidal ideations: Secondary | ICD-10-CM | POA: Diagnosis present

## 2015-02-07 DIAGNOSIS — L02419 Cutaneous abscess of limb, unspecified: Secondary | ICD-10-CM

## 2015-02-07 DIAGNOSIS — C50919 Malignant neoplasm of unspecified site of unspecified female breast: Secondary | ICD-10-CM | POA: Diagnosis present

## 2015-02-07 DIAGNOSIS — T402X2A Poisoning by other opioids, intentional self-harm, initial encounter: Secondary | ICD-10-CM | POA: Diagnosis present

## 2015-02-07 DIAGNOSIS — T391X2A Poisoning by 4-Aminophenol derivatives, intentional self-harm, initial encounter: Secondary | ICD-10-CM | POA: Diagnosis present

## 2015-02-07 DIAGNOSIS — K712 Toxic liver disease with acute hepatitis: Secondary | ICD-10-CM | POA: Diagnosis present

## 2015-02-07 DIAGNOSIS — J449 Chronic obstructive pulmonary disease, unspecified: Secondary | ICD-10-CM | POA: Diagnosis present

## 2015-02-07 DIAGNOSIS — Z791 Long term (current) use of non-steroidal anti-inflammatories (NSAID): Secondary | ICD-10-CM | POA: Diagnosis not present

## 2015-02-07 DIAGNOSIS — G8929 Other chronic pain: Secondary | ICD-10-CM | POA: Diagnosis present

## 2015-02-07 DIAGNOSIS — T50905A Adverse effect of unspecified drugs, medicaments and biological substances, initial encounter: Secondary | ICD-10-CM

## 2015-02-07 DIAGNOSIS — M7989 Other specified soft tissue disorders: Secondary | ICD-10-CM

## 2015-02-07 DIAGNOSIS — Z800 Family history of malignant neoplasm of digestive organs: Secondary | ICD-10-CM | POA: Diagnosis not present

## 2015-02-07 DIAGNOSIS — T39392A Poisoning by other nonsteroidal anti-inflammatory drugs [NSAID], intentional self-harm, initial encounter: Secondary | ICD-10-CM

## 2015-02-07 DIAGNOSIS — R0600 Dyspnea, unspecified: Secondary | ICD-10-CM

## 2015-02-07 DIAGNOSIS — N289 Disorder of kidney and ureter, unspecified: Secondary | ICD-10-CM

## 2015-02-07 DIAGNOSIS — N179 Acute kidney failure, unspecified: Secondary | ICD-10-CM

## 2015-02-07 DIAGNOSIS — F332 Major depressive disorder, recurrent severe without psychotic features: Secondary | ICD-10-CM | POA: Diagnosis present

## 2015-02-07 DIAGNOSIS — I4510 Unspecified right bundle-branch block: Secondary | ICD-10-CM | POA: Diagnosis present

## 2015-02-07 DIAGNOSIS — T43022A Poisoning by tetracyclic antidepressants, intentional self-harm, initial encounter: Secondary | ICD-10-CM

## 2015-02-07 DIAGNOSIS — R740 Nonspecific elevation of levels of transaminase and lactic acid dehydrogenase [LDH]: Secondary | ICD-10-CM

## 2015-02-07 DIAGNOSIS — R7401 Elevation of levels of liver transaminase levels: Secondary | ICD-10-CM

## 2015-02-07 DIAGNOSIS — E876 Hypokalemia: Secondary | ICD-10-CM | POA: Diagnosis present

## 2015-02-07 DIAGNOSIS — F17210 Nicotine dependence, cigarettes, uncomplicated: Secondary | ICD-10-CM | POA: Diagnosis present

## 2015-02-07 DIAGNOSIS — E039 Hypothyroidism, unspecified: Secondary | ICD-10-CM | POA: Diagnosis present

## 2015-02-07 DIAGNOSIS — T1491 Suicide attempt: Secondary | ICD-10-CM

## 2015-02-07 DIAGNOSIS — K716 Toxic liver disease with hepatitis, not elsewhere classified: Secondary | ICD-10-CM

## 2015-02-07 DIAGNOSIS — K759 Inflammatory liver disease, unspecified: Secondary | ICD-10-CM | POA: Diagnosis not present

## 2015-02-07 DIAGNOSIS — L03119 Cellulitis of unspecified part of limb: Secondary | ICD-10-CM

## 2015-02-07 DIAGNOSIS — L03115 Cellulitis of right lower limb: Secondary | ICD-10-CM | POA: Diagnosis present

## 2015-02-07 DIAGNOSIS — T50901A Poisoning by unspecified drugs, medicaments and biological substances, accidental (unintentional), initial encounter: Secondary | ICD-10-CM | POA: Diagnosis present

## 2015-02-07 LAB — URINALYSIS, ROUTINE W REFLEX MICROSCOPIC
Bilirubin Urine: NEGATIVE
Glucose, UA: NEGATIVE mg/dL
KETONES UR: NEGATIVE mg/dL
LEUKOCYTES UA: NEGATIVE
Nitrite: NEGATIVE
PH: 6 (ref 5.0–8.0)
Protein, ur: 30 mg/dL — AB
Specific Gravity, Urine: 1.02 (ref 1.005–1.030)
Urobilinogen, UA: 1 mg/dL (ref 0.0–1.0)

## 2015-02-07 LAB — CBC
HCT: 33.5 % — ABNORMAL LOW (ref 36.0–46.0)
Hemoglobin: 10.8 g/dL — ABNORMAL LOW (ref 12.0–15.0)
MCH: 28.5 pg (ref 26.0–34.0)
MCHC: 32.2 g/dL (ref 30.0–36.0)
MCV: 88.4 fL (ref 78.0–100.0)
Platelets: 510 10*3/uL — ABNORMAL HIGH (ref 150–400)
RBC: 3.79 MIL/uL — ABNORMAL LOW (ref 3.87–5.11)
RDW: 17.2 % — ABNORMAL HIGH (ref 11.5–15.5)
WBC: 16.9 10*3/uL — ABNORMAL HIGH (ref 4.0–10.5)

## 2015-02-07 LAB — I-STAT CHEM 8, ED
BUN: 27 mg/dL — AB (ref 6–23)
CALCIUM ION: 1.01 mmol/L — AB (ref 1.12–1.23)
Chloride: 98 mmol/L (ref 96–112)
Creatinine, Ser: 1.2 mg/dL — ABNORMAL HIGH (ref 0.50–1.10)
Glucose, Bld: 89 mg/dL (ref 70–99)
HCT: 37 % (ref 36.0–46.0)
Hemoglobin: 12.6 g/dL (ref 12.0–15.0)
Potassium: 2.9 mmol/L — ABNORMAL LOW (ref 3.5–5.1)
Sodium: 134 mmol/L — ABNORMAL LOW (ref 135–145)
TCO2: 21 mmol/L (ref 0–100)

## 2015-02-07 LAB — COMPREHENSIVE METABOLIC PANEL
ALBUMIN: 2.9 g/dL — AB (ref 3.5–5.2)
ALK PHOS: 139 U/L — AB (ref 39–117)
ALT: 314 U/L — AB (ref 0–35)
AST: 996 U/L — AB (ref 0–37)
Anion gap: 11 (ref 5–15)
BUN: 23 mg/dL (ref 6–23)
CALCIUM: 8.5 mg/dL (ref 8.4–10.5)
CO2: 24 mmol/L (ref 19–32)
Chloride: 99 mmol/L (ref 96–112)
Creatinine, Ser: 1.32 mg/dL — ABNORMAL HIGH (ref 0.50–1.10)
GFR calc Af Amer: 51 mL/min — ABNORMAL LOW (ref >90)
GFR calc non Af Amer: 44 mL/min — ABNORMAL LOW (ref >90)
Glucose, Bld: 90 mg/dL (ref 70–99)
POTASSIUM: 2.8 mmol/L — AB (ref 3.5–5.1)
SODIUM: 134 mmol/L — AB (ref 135–145)
Total Bilirubin: 0.8 mg/dL (ref 0.3–1.2)
Total Protein: 5.6 g/dL — ABNORMAL LOW (ref 6.0–8.3)

## 2015-02-07 LAB — MAGNESIUM: Magnesium: 1.8 mg/dL (ref 1.5–2.5)

## 2015-02-07 LAB — PROTIME-INR
INR: 1.18 (ref 0.00–1.49)
PROTHROMBIN TIME: 15.1 s (ref 11.6–15.2)

## 2015-02-07 LAB — SALICYLATE LEVEL: Salicylate Lvl: <4 mg/dL (ref 2.8–20.0)

## 2015-02-07 LAB — PHENYTOIN LEVEL, TOTAL: Phenytoin Lvl: <2.5 ug/mL — ABNORMAL LOW (ref 10.0–20.0)

## 2015-02-07 LAB — RAPID URINE DRUG SCREEN, HOSP PERFORMED
Amphetamines: NOT DETECTED
Barbiturates: NOT DETECTED
Benzodiazepines: NOT DETECTED
Cocaine: NOT DETECTED
Opiates: POSITIVE — AB
TETRAHYDROCANNABINOL: NOT DETECTED

## 2015-02-07 LAB — URINE MICROSCOPIC-ADD ON

## 2015-02-07 LAB — ACETAMINOPHEN LEVEL: Acetaminophen (Tylenol), Serum: 17.1 ug/mL (ref 10–30)

## 2015-02-07 LAB — HEPATITIS B SURFACE ANTIGEN: HEP B S AG: NEGATIVE

## 2015-02-07 LAB — PREGNANCY, URINE: Preg Test, Ur: NEGATIVE

## 2015-02-07 LAB — ETHANOL: Alcohol, Ethyl (B): <5 mg/dL (ref 0–9)

## 2015-02-07 LAB — HEPATITIS C ANTIBODY: HCV Ab: NEGATIVE

## 2015-02-07 LAB — VALPROIC ACID LEVEL

## 2015-02-07 LAB — CK: Total CK: 324 U/L — ABNORMAL HIGH (ref 7–177)

## 2015-02-07 LAB — MRSA PCR SCREENING: MRSA by PCR: NEGATIVE

## 2015-02-07 LAB — TSH: TSH: 0.168 u[IU]/mL — ABNORMAL LOW (ref 0.350–4.500)

## 2015-02-07 MED ORDER — POTASSIUM CHLORIDE 10 MEQ/100ML IV SOLN
10.0000 meq | INTRAVENOUS | Status: AC
  Administered 2015-02-07 (×3): 10 meq via INTRAVENOUS
  Filled 2015-02-07 (×3): qty 100

## 2015-02-07 MED ORDER — POTASSIUM CHLORIDE 10 MEQ/100ML IV SOLN
10.0000 meq | INTRAVENOUS | Status: DC
  Administered 2015-02-07: 10 meq via INTRAVENOUS
  Filled 2015-02-07: qty 100

## 2015-02-07 MED ORDER — LEVOTHYROXINE SODIUM 150 MCG PO TABS
150.0000 ug | ORAL_TABLET | Freq: Every day | ORAL | Status: DC
  Administered 2015-02-08 – 2015-02-12 (×5): 150 ug via ORAL
  Filled 2015-02-07 (×3): qty 1
  Filled 2015-02-07 (×2): qty 2
  Filled 2015-02-07: qty 1
  Filled 2015-02-07: qty 2

## 2015-02-07 MED ORDER — ANASTROZOLE 1 MG PO TABS
1.0000 mg | ORAL_TABLET | Freq: Every day | ORAL | Status: DC
  Administered 2015-02-08 – 2015-02-12 (×5): 1 mg via ORAL
  Filled 2015-02-07 (×9): qty 1

## 2015-02-07 MED ORDER — SODIUM CHLORIDE 0.9 % IV BOLUS (SEPSIS): 2000.0000 mL | Freq: Once | INTRAVENOUS | Status: DC

## 2015-02-07 MED ORDER — ONDANSETRON HCL 4 MG PO TABS: 4.0000 mg | ORAL_TABLET | Freq: Four times a day (QID) | ORAL | Status: DC | PRN

## 2015-02-07 MED ORDER — ENOXAPARIN SODIUM 40 MG/0.4ML ~~LOC~~ SOLN
40.0000 mg | SUBCUTANEOUS | Status: DC
  Administered 2015-02-07 – 2015-02-12 (×6): 40 mg via SUBCUTANEOUS
  Filled 2015-02-07 (×6): qty 0.4

## 2015-02-07 MED ORDER — ACETYLCYSTEINE LOAD VIA INFUSION
150.0000 mg/kg | Freq: Once | INTRAVENOUS | Status: AC
  Administered 2015-02-07: 11220 mg via INTRAVENOUS
  Filled 2015-02-07: qty 281

## 2015-02-07 MED ORDER — CEFAZOLIN SODIUM 1-5 GM-% IV SOLN
1.0000 g | Freq: Three times a day (TID) | INTRAVENOUS | Status: DC
  Administered 2015-02-07 – 2015-02-12 (×16): 1 g via INTRAVENOUS
  Filled 2015-02-07 (×20): qty 50

## 2015-02-07 MED ORDER — SODIUM CHLORIDE 0.9 % IJ SOLN
3.0000 mL | Freq: Two times a day (BID) | INTRAMUSCULAR | Status: DC
  Administered 2015-02-07 – 2015-02-10 (×4): 3 mL via INTRAVENOUS

## 2015-02-07 MED ORDER — SODIUM CHLORIDE 0.9 % IV SOLN
INTRAVENOUS | Status: DC
  Administered 2015-02-07 – 2015-02-12 (×4): via INTRAVENOUS
  Filled 2015-02-07 (×12): qty 1000

## 2015-02-07 MED ORDER — ONDANSETRON HCL 4 MG/2ML IJ SOLN: 4.0000 mg | Freq: Four times a day (QID) | INTRAMUSCULAR | Status: DC | PRN

## 2015-02-07 MED ORDER — ACETYLCYSTEINE 200 MG/ML IV SOLN
15.0000 mg/kg/h | INTRAVENOUS | Status: DC
  Administered 2015-02-07 – 2015-02-08 (×2): 15 mg/kg/h via INTRAVENOUS
  Filled 2015-02-07 (×3): qty 200

## 2015-02-07 NOTE — ED Notes (Signed)
Bed: WA22 Expected date:  Expected time:  Means of arrival:  Comments: EMS 

## 2015-02-07 NOTE — Assessment & Plan Note (Signed)
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 She had protein calorie malnutrition with profound leg edema. Patient is tolerating Arimidex extremely well without any major problems or concerns.  Treatment plan: 1. Breast conserving surgery followed by 2. Radiation therapy followed by 3. Antiestrogen therapy with anastrozole  Return to clinic after surgery

## 2015-02-07 NOTE — Progress Notes (Signed)
MEDICATION RELATED CONSULT NOTE - INITIAL   Pharmacy Consult for Acetadote Indication: Apap OD  No Known Allergies  Patient Measurements: Weight: 165 lb (74.844 kg)   Vital Signs: BP: 135/74 mmHg (03/16 0406) Pulse Rate: 94 (03/16 0406) Intake/Output from previous day:   Intake/Output from this shift:    Labs:  Recent Labs  02/07/15 0324 02/07/15 0337  WBC 16.9*  --   HGB 10.8* 12.6  HCT 33.5* 37.0  PLT 510*  --   CREATININE 1.32* 1.20*  ALBUMIN 2.9*  --   PROT 5.6*  --   AST 996*  --   ALT 314*  --   ALKPHOS 139*  --   BILITOT 0.8  --    Estimated Creatinine Clearance: 52.9 mL/min (by C-G formula based on Cr of 1.2).   Microbiology: No results found for this or any previous visit (from the past 720 hour(s)).  Medical History: Past Medical History  Diagnosis Date  . Hypertension   . Thyroid disease     hyperthyroidism  . Chronic back pain   . Chronic neck pain   . Depression   . COPD (chronic obstructive pulmonary disease)   . Anemia   . Cancer approx 2006    colon cancer  . Blood transfusion without reported diagnosis   . GERD (gastroesophageal reflux disease)   . Arthritis     Medications:  Scheduled:   Infusions:  . acetylcysteine     Followed by  . acetylcysteine    . potassium chloride 10 mEq (02/07/15 0543)    Assessment: 25 yoF admitted s/p Apap OD.  Supposedly took 70 Percocet, 48 Morphine, 15 Remeron and 18 Meloxicam. Baseline Apap=17.1 and LFTs elevated.  Acetadote per Rx.  Goal of Therapy:  Treat Apap OD  Plan:   Acetadote 150mg /kg bolus over 1 hr then 15mg /kg/hr.  F/u Apap/LFTs/INR  Dorrene German 02/07/2015,5:57 AM

## 2015-02-07 NOTE — ED Provider Notes (Signed)
CSN: [id number]     Arrival date & time 02/07/15  0211 History   First MD Initiated Contact with Patient 02/07/15 0216     Chief Complaint  Patient presents with  . Ingestion     (Consider location/radiation/quality/duration/timing/severity/associated sxs/prior Treatment) Patient is a 59 y.o. female presenting with Ingested Medication. The history is provided by the police and medical records. History limited by: refusing to speak.  Ingestion This is a new (unknown found with a note and many missing narcotics) problem. Episode onset: unknown. The problem occurs constantly. The problem has not changed since onset.Associated symptoms comments: Will not say. Nothing aggravates the symptoms. Nothing relieves the symptoms. She has tried nothing for the symptoms. The treatment provided no relief.    Past Medical History  Diagnosis Date  . Hypertension   . Thyroid disease     hyperthyroidism  . Chronic back pain   . Chronic neck pain   . Depression   . COPD (chronic obstructive pulmonary disease)   . Anemia   . Cancer approx 2006    colon cancer  . Blood transfusion without reported diagnosis   . GERD (gastroesophageal reflux disease)   . Arthritis    Past Surgical History  Procedure Laterality Date  . Cholecystectomy    . Tonsillectomy    . Abdominal hysterectomy    . Hernia repair    . Colon surgery  approx. 2006    for colon cancer  . Gastric bypass  2000   Family History  Problem Relation Age of Onset  . Colon cancer Father 40  . Prostate cancer Brother    History  Substance Use Topics  . Smoking status: Former Smoker -- 1.00 packs/day for 40 years    Types: Cigarettes    Quit date: 12/27/2013  . Smokeless tobacco: Current User    Last Attempt to Quit: 04/04/2013  . Alcohol Use: No   OB History    No data available     Review of Systems  Unable to perform ROS     Allergies  Review of patient's allergies indicates no known allergies.  Home Medications    Prior to Admission medications   Medication Sig Start Date End Date Taking? Authorizing Provider  oxyCODONE-acetaminophen (PERCOCET) 10-325 MG per tablet Take 1 tablet by mouth every 4 (four) hours as needed for pain (pain).  06/15/14  Yes Historical Provider, MD  ALPRAZolam Duanne Moron) 0.5 MG tablet Take 1-2 30-60 minutes before MRI. May repeat while in MRI. Patient not taking: Reported on 01/15/2015 12/30/14   Melvenia Beam, MD  anastrozole (ARIMIDEX) 1 MG tablet Take 1 tablet (1 mg total) by mouth daily. 12/27/14   Nicholas Lose, MD  fentaNYL (DURAGESIC - DOSED MCG/HR) 50 MCG/HR Place 50 mcg onto the skin every 3 (three) days.  11/22/14   Historical Provider, MD  furosemide (LASIX) 40 MG tablet Take 1 tablet by mouth daily.  11/14/14   Historical Provider, MD  levothyroxine (SYNTHROID, LEVOTHROID) 150 MCG tablet Take 150 mcg by mouth daily before breakfast.  10/05/14   Historical Provider, MD  omeprazole (PRILOSEC) 20 MG capsule Take 20 mg by mouth daily.    Historical Provider, MD  ondansetron (ZOFRAN) 4 MG tablet Take 1 tablet (4 mg total) by mouth every 8 (eight) hours as needed for nausea or vomiting (1 pill by mouth as needed for nausea/vomiting x1, then repeat x1 the day before the procedure and also the day of the procedure with prep x1). Patient not taking:  Reported on 01/15/2015 08/02/14   Milus Banister, MD  oxyCODONE (ROXICODONE) 15 MG immediate release tablet  12/13/14   Historical Provider, MD  potassium chloride (MICRO-K) 10 MEQ CR capsule Take 10 mEq by mouth daily. 11/27/14   Historical Provider, MD   BP 135/74 mmHg  Pulse 94  Resp 18  Wt 165 lb (74.844 kg)  SpO2 93% Physical Exam  Constitutional: She has a sickly appearance.  Spider angiomas of the cheeks  HENT:  Head: Normocephalic and atraumatic.  Mouth/Throat: Oropharynx is clear and moist.  Eyes: Conjunctivae are normal. Pupils are equal, round, and reactive to light.  Neck: Normal range of motion. Neck supple.   Cardiovascular: Normal rate, regular rhythm and intact distal pulses.   Pulmonary/Chest: Effort normal and breath sounds normal. No stridor. No respiratory distress. She has no wheezes. She has no rales.  Abdominal: Soft. Bowel sounds are normal. There is no tenderness. There is no rebound and no guarding.  Musculoskeletal: Normal range of motion. She exhibits no edema.  Neurological: She is alert. She has normal reflexes.  Skin: Skin is warm. No rash noted. She is not diaphoretic.  No fentanyl patches  Psychiatric:  Unable to assess    ED Course  Procedures (including critical care time) Labs Review Labs Reviewed  CBC - Abnormal; Notable for the following:    WBC 16.9 (*)    RBC 3.79 (*)    Hemoglobin 10.8 (*)    HCT 33.5 (*)    RDW 17.2 (*)    Platelets 510 (*)    All other components within normal limits  COMPREHENSIVE METABOLIC PANEL - Abnormal; Notable for the following:    Sodium 134 (*)    Potassium 2.8 (*)    Creatinine, Ser 1.32 (*)    Total Protein 5.6 (*)    Albumin 2.9 (*)    AST 996 (*)    ALT 314 (*)    Alkaline Phosphatase 139 (*)    GFR calc non Af Amer 44 (*)    GFR calc Af Amer 51 (*)    All other components within normal limits  URINE RAPID DRUG SCREEN (HOSP PERFORMED) - Abnormal; Notable for the following:    Opiates POSITIVE (*)    All other components within normal limits  VALPROIC ACID LEVEL - Abnormal; Notable for the following:    Valproic Acid Lvl <10.0 (*)    All other components within normal limits  CK - Abnormal; Notable for the following:    Total CK 324 (*)    All other components within normal limits  PHENYTOIN LEVEL, TOTAL - Abnormal; Notable for the following:    Phenytoin Lvl <2.5 (*)    All other components within normal limits  I-STAT CHEM 8, ED - Abnormal; Notable for the following:    Sodium 134 (*)    Potassium 2.9 (*)    BUN 27 (*)    Creatinine, Ser 1.20 (*)    Calcium, Ion 1.01 (*)    All other components within normal  limits  ETHANOL  ACETAMINOPHEN LEVEL  SALICYLATE LEVEL  PREGNANCY, URINE  PROTIME-INR    Imaging Review Dg Abd Acute W/chest  02/07/2015   CLINICAL DATA:  Overdose.  EXAM: ACUTE ABDOMEN SERIES (ABDOMEN 2 VIEW & CHEST 1 VIEW)  COMPARISON:  11/19/2014  FINDINGS: There is no evidence of dilated bowel loops or free intraperitoneal air. No radiopaque calculi or other significant radiographic abnormality is seen. Heart size and mediastinal contours are within normal  limits. Both lungs are clear.  IMPRESSION: Negative abdominal radiographs.  No acute cardiopulmonary disease.   Electronically Signed   By: Andreas Newport M.D.   On: 02/07/2015 04:07     EKG Interpretation None     ED ECG REPORT     EKG Interpretation  Date/Time:  Wednesday February 07 2015 02:42:12 EDT Ventricular Rate:  113 PR Interval:  140 QRS Duration: 170 QT Interval:  375 QTC Calculation: 514 R Axis:   11 Text Interpretation:  Sinus tachycardia LAE, consider biatrial enlargement Right bundle branch block Confirmed by [facility]  MD, Keeven Matty ([id number]) on 02/07/2015 6:23:17 AM        MDM   Final diagnoses:  Overdose  Tylenol overdose, intentional self-harm, initial encounter  Police have suicide note.  Committed by [employer].  Will need psychiatric admission following medical clearance    Based on LFTs and tylenol level with meds with tylenol in them started acetadote.  Per Poison control increased dose of NAC   MDM Reviewed: previous chart, nursing note and vitals Reviewed previous: labs and x-ray Interpretation: labs and ECG (extremely elevated LFTs positive tylenol level, elevated WBC) Total time providing critical care: 75-105 minutes. This excludes time spent performing separately reportable procedures and services. Consults: admitting MD     Medications  potassium chloride 10 mEq in 100 mL IVPB (10 mEq Intravenous New Bag/Given 02/07/15 0543)  acetylcysteine (ACETADOTE) 40 mg/mL load via infusion  11,220 mg (not administered)    Followed by  acetylcysteine (ACETADOTE) 40,000 mg in dextrose 5 % 1,000 mL (40 mg/mL) infusion (not administered)  sodium chloride 0.9 % bolus 2,000 mL (not administered)   CRITICAL CARE Performed by: Carlisle Beers Total critical care time: 91 minutes Critical care time was exclusive of separately billable procedures and treating other patients. Critical care was necessary to treat or prevent imminent or life-threatening deterioration. Critical care was time spent personally by me on the following activities: development of treatment plan with patient and/or surrogate as well as nursing, discussions with consultants, evaluation of patient's response to treatment, examination of patient, obtaining history from patient or surrogate, ordering and performing treatments and interventions, ordering and review of laboratory studies, ordering and review of radiographic studies, pulse oximetry and re-evaluation of patient's condition.       Veatrice Kells, MD 02/07/15 [phone]

## 2015-02-07 NOTE — Progress Notes (Signed)
CARE MANAGEMENT NOTE 02/07/2015  Patient:  Jacqueline Moyer,Jacqueline Moyer   Account Number:  [id number]  Date Initiated:  02/07/2015  Documentation initiated by:  Bairon Klemann  Subjective/Objective Assessment:   overdose     Action/Plan:   tbd by psych   Anticipated DC Date:  02/10/2015   Anticipated DC Plan:  HOME/SELF CARE  In-house referral  Clinical Social Worker      DC Planning Services  CM consult      [facility] Choice  NA   Choice offered to / List presented to:             Status of service:  In process, will continue to follow Medicare Important Message given?   (If response is "NO", the following Medicare IM given date fields will be blank) Date Medicare IM given:   Medicare IM given by:   Date Additional Medicare IM given:   Additional Medicare IM given by:    Discharge Disposition:    Per UR Regulation:  Reviewed for med. necessity/level of care/duration of stay  If discussed at [place] of Stay Meetings, dates discussed:    Comments:  February 07, 2015/Ilona Colley L. Rosana Hoes, RN, BSN, CCM. Case Management [place] [phone] No discharge needs present of time of review.

## 2015-02-07 NOTE — Evaluation (Signed)
Intentional drug overdose w/ likely tylenol component though tylenol level WNL per report. Was found down by police with a ? Suicide note. Being commited by police per report. LFTs elevated. On augmented mucomyst protocol per poison control recs per EDP. . K 2.9-getting repleted. UDS + opiates. ? ETOH history, though ETOH negative.Had multiple opiates on her possessio. Elevated CK @ 324.  Getting IVF. Hemodynamically stable. Afebrile. Asked for stepdown bed.

## 2015-02-07 NOTE — Progress Notes (Signed)
Pt destat to low 80s. Pr refusing to wear nasal canula. RN offered mask and pt refused. Pt current oxygenation is 85 and holding. Will continue to advise and re-educate pt that oxygen therapy is necessary.

## 2015-02-07 NOTE — ED Notes (Signed)
Psych team at bedside .

## 2015-02-07 NOTE — Progress Notes (Signed)
VASCULAR LAB PRELIMINARY  PRELIMINARY  PRELIMINARY  PRELIMINARY  Bilateral lower extremity venous duplex  completed.    Preliminary report:  Bilateral:  No evidence of DVT, superficial thrombosis, or Baker's Cyst.    Keionna Kinnaird, RVT 02/07/2015, 11:41 AM

## 2015-02-07 NOTE — Progress Notes (Signed)
Attempted to get report from ED RN. RN involved in pt care at the moment and will call ICU RN back when available.
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
## 2015-02-07 NOTE — ED Notes (Signed)
Pt's home medications are in the pharmacy, per [place], RN.  Documentation receipt sent to ICU via tube station.

## 2015-02-07 NOTE — ED Notes (Signed)
Spoke with Jacqueline Moyer, [employer], who recommends monitoring patient for 6 hours for respiratory depression.
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
## 2015-02-07 NOTE — ED Notes (Signed)
Dr Randal Buba notified of poison control recommendations, spoke to pharmacist [facility]) advised her of recommendations. Beth requested recommendations for dosing sent to pharmacy for review.
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
## 2015-02-07 NOTE — ED Notes (Signed)
Spoke to [employer] at [employer], updated on labs and patient status. Jacqueline Moyer is faxing guidelines for PO & IV acetylcysteine, recommended higher level dosing based on current liver enzymes.

## 2015-02-07 NOTE — ED Notes (Signed)
1st attempt to call report, RN is unavailable. Call back information provided to floor secretary.

## 2015-02-08 DIAGNOSIS — K716 Toxic liver disease with hepatitis, not elsewhere classified: Secondary | ICD-10-CM

## 2015-02-08 DIAGNOSIS — R740 Nonspecific elevation of levels of transaminase and lactic acid dehydrogenase [LDH]: Secondary | ICD-10-CM

## 2015-02-08 DIAGNOSIS — F332 Major depressive disorder, recurrent severe without psychotic features: Secondary | ICD-10-CM

## 2015-02-08 DIAGNOSIS — T50905A Adverse effect of unspecified drugs, medicaments and biological substances, initial encounter: Secondary | ICD-10-CM

## 2015-02-08 DIAGNOSIS — T391X2A Poisoning by 4-Aminophenol derivatives, intentional self-harm, initial encounter: Secondary | ICD-10-CM | POA: Insufficient documentation

## 2015-02-08 DIAGNOSIS — K759 Inflammatory liver disease, unspecified: Secondary | ICD-10-CM

## 2015-02-08 LAB — COMPREHENSIVE METABOLIC PANEL
ALT: 623 U/L — AB (ref 0–35)
ANION GAP: 10 (ref 5–15)
AST: 1089 U/L — AB (ref 0–37)
Albumin: 2.3 g/dL — ABNORMAL LOW (ref 3.5–5.2)
Alkaline Phosphatase: 106 U/L (ref 39–117)
BILIRUBIN TOTAL: 1.1 mg/dL (ref 0.3–1.2)
BUN: 12 mg/dL (ref 6–23)
CO2: 24 mmol/L (ref 19–32)
CREATININE: 0.57 mg/dL (ref 0.50–1.10)
Calcium: 7.8 mg/dL — ABNORMAL LOW (ref 8.4–10.5)
Chloride: 105 mmol/L (ref 96–112)
GFR calc Af Amer: >90 mL/min (ref >90)
Glucose, Bld: 102 mg/dL — ABNORMAL HIGH (ref 70–99)
POTASSIUM: 2.3 mmol/L — AB (ref 3.5–5.1)
Sodium: 139 mmol/L (ref 135–145)
Total Protein: 4.6 g/dL — ABNORMAL LOW (ref 6.0–8.3)

## 2015-02-08 LAB — URINE CULTURE: Colony Count: 50000

## 2015-02-08 LAB — CBC
HCT: 29.8 % — ABNORMAL LOW (ref 36.0–46.0)
Hemoglobin: 9.8 g/dL — ABNORMAL LOW (ref 12.0–15.0)
MCH: 28.3 pg (ref 26.0–34.0)
MCHC: 32.9 g/dL (ref 30.0–36.0)
MCV: 86.1 fL (ref 78.0–100.0)
PLATELETS: 414 10*3/uL — AB (ref 150–400)
RBC: 3.46 MIL/uL — AB (ref 3.87–5.11)
RDW: 17.5 % — AB (ref 11.5–15.5)
WBC: 11.4 10*3/uL — AB (ref 4.0–10.5)

## 2015-02-08 LAB — PROTIME-INR
INR: 1.4 (ref 0.00–1.49)
Prothrombin Time: 17.3 seconds — ABNORMAL HIGH (ref 11.6–15.2)

## 2015-02-08 LAB — HIV ANTIBODY (ROUTINE TESTING W REFLEX): HIV Screen 4th Generation wRfx: NONREACTIVE

## 2015-02-08 LAB — ACETAMINOPHEN LEVEL

## 2015-02-08 LAB — CK: Total CK: 122 U/L (ref 7–177)

## 2015-02-08 MED ORDER — MAGNESIUM SULFATE 2 GM/50ML IV SOLN
2.0000 g | Freq: Once | INTRAVENOUS | Status: AC
  Administered 2015-02-08: 2 g via INTRAVENOUS
  Filled 2015-02-08: qty 50

## 2015-02-08 MED ORDER — POTASSIUM CHLORIDE 10 MEQ/100ML IV SOLN
10.0000 meq | INTRAVENOUS | Status: AC
  Administered 2015-02-08 (×6): 10 meq via INTRAVENOUS
  Filled 2015-02-08 (×2): qty 100

## 2015-02-08 MED ORDER — POTASSIUM CHLORIDE CRYS ER 20 MEQ PO TBCR
40.0000 meq | EXTENDED_RELEASE_TABLET | Freq: Once | ORAL | Status: AC
  Administered 2015-02-08: 40 meq via ORAL
  Filled 2015-02-08: qty 2

## 2015-02-08 MED ORDER — POTASSIUM CHLORIDE 10 MEQ/100ML IV SOLN
INTRAVENOUS | Status: AC
  Filled 2015-02-08: qty 100

## 2015-02-08 NOTE — Progress Notes (Signed)
PROGRESS NOTE  Jacqueline Moyer VVO:[id number] DOB: 10/24/1956 DOA: 02/07/2015 PCP: Gavin Pound, MD  Assessment/Plan: Intentional drug ingestion--acetaminophen - Based on caluclations from her pill bottles, patient could have taken as many as: 70 Percocet, 48 Morphine tabs, 15 Remeron tabs, 18 Meloxicam. -I spoke with poison control--continue N-acetylcysteine for at least the next 24 hours  -Repeat acetaminophen level--neg,  -INR--1.40,  -AST 1089, ALT 623, alkaline phosphatase is 106, total bilirubin 1.1 -consulted psychiatry--recommended inpt psychiatry care when stable -one-on-one sitter -hold all opoids and hypnotics Transaminasemia--drug induced hepatitis -Hepatitis B surface antigen negative -Hepatitis C antibody negative -HIV antibody negative -Appreciate GI consult -Continue N-acetylcysteine -Repeat LFTs, INR in 24 hours  Abdominal ultrasound 2.5 cm left renal hypoechoic lesion- AKI -Volume depletion in the setting of NSAIDs  -Rehydrate-->improved -d/c meloxicam and ibuprofen Cellulitis legs -?cannot rule out component of venous stasis dermatitis although pt states redness only started 1-2d ago -venous duplex r/o DVT -blood cultures--negative to date -IV cefazolin--> improving  Hypokalemia  -Replete   -Check magnesium--1.8  Breast cancer  -Invasive mammary  -Follows Dr. Lindi Adie -Continue Arimidex  Leukocytosis  -Likely stress demargination and possible cellulitis legs -Chest x-ray negative for infiltrates  -UA and urine culture  -blood cultures Hypothyroidism  -Continue Synthroid  -Check TSH  RBBB -not changed from prior EKGs -no CP   Family Communication:   Pt at beside Disposition Plan:   Home when medically stable       Procedures/Studies: US Abdomen Complete  02/07/2015   CLINICAL DATA:  Elevated liver function tests. Prior cholecystectomy. Acute kidney injury. Personal history of colon carcinoma.  EXAM: ULTRASOUND ABDOMEN  COMPLETE  COMPARISON:  CT on 11/20/2014  FINDINGS: Gallbladder: Surgically absent.  Common bile duct: Diameter: 4 mm, which is within normal limits  Liver: No focal lesion identified. Within normal limits in parenchymal echogenicity.  IVC: No abnormality visualized.  Pancreas: Visualized portion unremarkable.  Spleen: Size and appearance within normal limits.  Right Kidney: Length: 11.4 cm. Echogenicity within normal limits. No mass or hydronephrosis visualized.  Left Kidney: Length: 11.2 cm. Echogenicity within normal limits. No evidence hydronephrosis. A heterogeneous hypoechoic lesion with central calcification seen in the midpole of the left kidney. This measures 2.1 x 2.5 x 2.3 cm. Although this is unchanged in size since recent exam, a renal neoplasm cannot be excluded.  Abdominal aorta: No aneurysm visualized.  Other findings: None.  IMPRESSION: Previous cholecystectomy.  No hepatobiliary abnormality identified.  2.5 cm partially calcified hypoechoic lesion in the left kidney. Renal neoplasm cannot be excluded. Abdomen MRI without and with contrast is recommended for further characterization.   Electronically Signed   By: Earle Gell M.D.   On: 02/07/2015 17:12   Dg Chest Port 1 View  02/07/2015   CLINICAL DATA:  Overdose  EXAM: PORTABLE CHEST - 1 VIEW  COMPARISON:  02/07/2015  FINDINGS: A single AP portable view of the chest demonstrates no focal airspace consolidation or alveolar edema. The lungs are grossly clear. There is no large effusion or pneumothorax. There is unchanged mild cardiomegaly. Cardiac and mediastinal contours are otherwise unremarkable.  IMPRESSION: Mild cardiomegaly.  No acute findings.   Electronically Signed   By: Andreas Newport M.D.   On: 02/07/2015 06:06   Dg Abd Acute W/chest  02/07/2015   CLINICAL DATA:  Overdose.  EXAM: ACUTE ABDOMEN SERIES (ABDOMEN 2 VIEW & CHEST 1 VIEW)  COMPARISON:  11/19/2014  FINDINGS: There is no evidence of dilated  bowel loops or free  intraperitoneal air. No radiopaque calculi or other significant radiographic abnormality is seen. Heart size and mediastinal contours are within normal limits. Both lungs are clear.  IMPRESSION: Negative abdominal radiographs.  No acute cardiopulmonary disease.   Electronically Signed   By: Andreas Newport M.D.   On: 02/07/2015 04:07         Subjective: Patient denies fevers, chills, headache, chest pain, dyspnea, nausea, vomiting, diarrhea, abdominal pain, dysuria, hematuria   Objective: Filed Vitals:   02/08/15 0400 02/08/15 0410 02/08/15 0510 02/08/15 1200  BP:   146/72   Pulse: 90  84   Temp:  99.8 F (37.7 C)  98.4 F (36.9 C)  TempSrc:  Oral  Oral  Resp: 18  15   Height:      Weight:      SpO2: 94%  90%     Intake/Output Summary (Last 24 hours) at 02/08/15 1433 Last data filed at 02/08/15 0600  Gross per 24 hour  Intake   1070 ml  Output      0 ml  Net   1070 ml   Weight change: 2.857 kg (6 lb 4.8 oz) Exam:   General:  Pt is alert, follows commands appropriately, not in acute distress  HEENT: No icterus, No thrush, [place]/AT  Cardiovascular: RRR, S1/S2, no rubs, no gallops  Respiratory: CTA bilaterally, no wheezing, no crackles, no rhonchi  Abdomen: Soft/+BS, non tender, non distended, no guarding  Extremities: No edema, No lymphangitis, No petechiae, No rashes, no synovitis  Data Reviewed: Basic Metabolic Panel:  Recent Labs Lab 02/07/15 0324 02/07/15 0337 02/07/15 1020 02/08/15 0403  NA 134* 134*  --  139  K 2.8* 2.9*  --  2.3*  CL 99 98  --  105  CO2 24  --   --  24  GLUCOSE 90 89  --  102*  BUN 23 27*  --  12  CREATININE 1.32* 1.20*  --  0.57  CALCIUM 8.5  --   --  7.8*  MG  --   --  1.8  --    Liver Function Tests:  Recent Labs Lab 02/07/15 0324 02/08/15 0403  AST 996* 1089*  ALT 314* 623*  ALKPHOS 139* 106  BILITOT 0.8 1.1  PROT 5.6* 4.6*  ALBUMIN 2.9* 2.3*   No results for input(s): LIPASE, AMYLASE in the last 168 hours. No  results for input(s): AMMONIA in the last 168 hours. CBC:  Recent Labs Lab 02/07/15 0324 02/07/15 0337 02/08/15 0403  WBC 16.9*  --  11.4*  HGB 10.8* 12.6 9.8*  HCT 33.5* 37.0 29.8*  MCV 88.4  --  86.1  PLT 510*  --  414*   Cardiac Enzymes:  Recent Labs Lab 02/07/15 0324 02/08/15 0403  CKTOTAL 324* 122   BNP: Invalid input(s): POCBNP CBG: No results for input(s): GLUCAP in the last 168 hours.  Recent Results (from the past 240 hour(s))  MRSA PCR Screening     Status: None   Collection Time: 02/07/15 10:00 AM  Result Value Ref Range Status   MRSA by PCR NEGATIVE NEGATIVE Final    Comment:        The GeneXpert MRSA Assay (FDA approved for NASAL specimens only), is one component of a comprehensive MRSA colonization surveillance program. It is not intended to diagnose MRSA infection nor to guide or monitor treatment for MRSA infections.   Culture, blood (routine x 2)     Status: None (Preliminary result)  Collection Time: 02/07/15 10:15 AM  Result Value Ref Range Status   Specimen Description BLOOD RIGHT ARM  Final   Special Requests BOTTLES DRAWN AEROBIC AND ANAEROBIC 5CC  Final   Culture   Final           BLOOD CULTURE RECEIVED NO GROWTH TO DATE CULTURE WILL BE HELD FOR 5 DAYS BEFORE ISSUING A FINAL NEGATIVE REPORT Performed at [facility]    Report Status PENDING  Incomplete  Culture, blood (routine x 2)     Status: None (Preliminary result)   Collection Time: 02/07/15 10:25 AM  Result Value Ref Range Status   Specimen Description BLOOD RIGHT ARM  Final   Special Requests BOTTLES DRAWN AEROBIC AND ANAEROBIC 3CC  Final   Culture   Final           BLOOD CULTURE RECEIVED NO GROWTH TO DATE CULTURE WILL BE HELD FOR 5 DAYS BEFORE ISSUING A FINAL NEGATIVE REPORT Note: Culture results may be compromised due to an inadequate volume of blood received in culture bottles. Performed at [facility]    Report Status PENDING  Incomplete      Scheduled Meds: . anastrozole  1 mg Oral Daily  .  ceFAZolin (ANCEF) IV  1 g Intravenous 3 times per day  . enoxaparin (LOVENOX) injection  40 mg Subcutaneous Q24H  . levothyroxine  150 mcg Oral QAC breakfast  . magnesium sulfate 1 - 4 g bolus IVPB  2 g Intravenous Once  . potassium chloride  40 mEq Oral Once  . sodium chloride  2,000 mL Intravenous Once  . sodium chloride  3 mL Intravenous Q12H   Continuous Infusions: . acetylcysteine 15 mg/kg/hr (02/08/15 1145)  . sodium chloride 0.9 % 1,000 mL with potassium chloride 20 mEq infusion 75 mL/hr at 02/07/15 1153     Daysha Ashmore, DO  [facility] Pager [phone]  If 7PM-7AM, please contact night-coverage [email or web address] Password [email or web address] 02/08/2015, 2:33 PM   LOS: 1 day

## 2015-02-08 NOTE — Progress Notes (Signed)
CRITICAL VALUE ALERT  Critical value received:  K+2.3  Date of notification:  02/08/15  Time of notification0513  Critical value read back:yes  Nurse who received alert: [phone]  MD notified (1st page): [facility]  Time of first page:0515  MD notified (2nd page):na  Time of second page:na  Responding MD: Schorr  Time MD responded: [phone]

## 2015-02-08 NOTE — Clinical Social Work Note (Signed)
CSW met with patient today in her ICU room. Patient appeared drowsy and with flat affect. She did admit to trying to "end my life" and reports that she was just tired of living and, "only going to get worse". Patient was limited due to her level of alertness- she appeared to almost fall asleep a few times. Discussed possible in patient treatment with her- encouraged her to think about this and I would check in tomorrow- full assessment to follow. Eduard Clos, MSW, [place]
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
## 2015-02-08 NOTE — Progress Notes (Signed)
MEDICATION RELATED CONSULT NOTE   Pharmacy Consult for Acetadote Indication: APAP overdose  No Known Allergies  Patient Measurements: Height: 5\' 4"  (162.6 cm) Weight: 171 lb 4.8 oz (77.7 kg) IBW/kg (Calculated) : 54.7  Vital Signs: Temp: 99.8 F (37.7 C) (03/17 0410) Temp Source: Oral (03/17 0410) BP: 146/72 mmHg (03/17 0510) Pulse Rate: 84 (03/17 0510)  Labs:  Recent Labs  02/07/15 0324 02/07/15 0337 02/07/15 1020 02/08/15 0403  WBC 16.9*  --   --  11.4*  HGB 10.8* 12.6  --  9.8*  HCT 33.5* 37.0  --  29.8*  PLT 510*  --   --  414*  CREATININE 1.32* 1.20*  --  0.57  MG  --   --  1.8  --   ALBUMIN 2.9*  --   --  2.3*  PROT 5.6*  --   --  4.6*  AST 996*  --   --  1089*  ALT 314*  --   --  623*  ALKPHOS 139*  --   --  106  BILITOT 0.8  --   --  1.1   Estimated Creatinine Clearance: 78.3 mL/min (by C-G formula based on Cr of 0.57).  Medications:  Infusions:  . acetylcysteine 15 mg/kg/hr (02/07/15 0810)  . sodium chloride 0.9 % 1,000 mL with potassium chloride 20 mEq infusion 75 mL/hr at 02/07/15 1153    Assessment: 48 yoF with Intentional drug ingestion--acetaminophen - Based on caluclations from her pill bottles, patient could have taken as many as: 70 Percocet, 48 Morphine tabs, 15 Remeron tabs, 18 Meloxicam. Poison Control contacted. Acetadote infusion initiated 3/17 AM. -psych consulted -holding all opoids and hypnotics - monitor for w/d  3/16 labs APAP 17.1 AST/ALT 996/314 CK 324 INR 1.18  3/17 labs APAP <10 AST/ALT 1089/623 (increasing) CK 122 INR 1.40 (increased) Also hypokalemic - getting replacement per MD Continuing N-AC protocol  Goal of Therapy:  Normalize LFTs  Plan:  Per protocol, pharmacy will re-order CMET and PT/INR for tomorrow AM. Continuing N-AC infusion.  Jacqueline Moyer 02/08/2015,9:44 AM
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
## 2015-02-09 ENCOUNTER — Inpatient Hospital Stay (HOSPITAL_COMMUNITY): Payer: Medicare Other

## 2015-02-09 LAB — COMPREHENSIVE METABOLIC PANEL
ALBUMIN: 2.2 g/dL — AB (ref 3.5–5.2)
ALT: 352 U/L — AB (ref 0–35)
AST: 248 U/L — ABNORMAL HIGH (ref 0–37)
Alkaline Phosphatase: 96 U/L (ref 39–117)
Anion gap: 9 (ref 5–15)
BUN: 8 mg/dL (ref 6–23)
CALCIUM: 7.9 mg/dL — AB (ref 8.4–10.5)
CO2: 23 mmol/L (ref 19–32)
CREATININE: 0.42 mg/dL — AB (ref 0.50–1.10)
Chloride: 110 mmol/L (ref 96–112)
GFR calc Af Amer: >90 mL/min (ref >90)
GFR calc non Af Amer: >90 mL/min (ref >90)
Glucose, Bld: 120 mg/dL — ABNORMAL HIGH (ref 70–99)
Potassium: 3.1 mmol/L — ABNORMAL LOW (ref 3.5–5.1)
SODIUM: 142 mmol/L (ref 135–145)
Total Bilirubin: 1 mg/dL (ref 0.3–1.2)
Total Protein: 4.7 g/dL — ABNORMAL LOW (ref 6.0–8.3)

## 2015-02-09 LAB — CBC
HCT: 30 % — ABNORMAL LOW (ref 36.0–46.0)
HEMOGLOBIN: 9.8 g/dL — AB (ref 12.0–15.0)
MCH: 28.6 pg (ref 26.0–34.0)
MCHC: 32.7 g/dL (ref 30.0–36.0)
MCV: 87.5 fL (ref 78.0–100.0)
Platelets: 421 10*3/uL — ABNORMAL HIGH (ref 150–400)
RBC: 3.43 MIL/uL — ABNORMAL LOW (ref 3.87–5.11)
RDW: 17.8 % — ABNORMAL HIGH (ref 11.5–15.5)
WBC: 10 10*3/uL (ref 4.0–10.5)

## 2015-02-09 LAB — PROTIME-INR
INR: 1.19 (ref 0.00–1.49)
Prothrombin Time: 15.2 seconds (ref 11.6–15.2)

## 2015-02-09 LAB — MAGNESIUM: Magnesium: 1.8 mg/dL (ref 1.5–2.5)

## 2015-02-09 MED ORDER — DILTIAZEM HCL 100 MG IV SOLR
5.0000 mg/h | INTRAVENOUS | Status: DC
  Filled 2015-02-09: qty 100

## 2015-02-09 MED ORDER — POTASSIUM CHLORIDE CRYS ER 20 MEQ PO TBCR
40.0000 meq | EXTENDED_RELEASE_TABLET | Freq: Once | ORAL | Status: AC
  Administered 2015-02-09: 40 meq via ORAL
  Filled 2015-02-09: qty 2

## 2015-02-09 MED ORDER — BISACODYL 10 MG RE SUPP
10.0000 mg | Freq: Once | RECTAL | Status: AC
  Administered 2015-02-09: 10 mg via RECTAL
  Filled 2015-02-09: qty 1

## 2015-02-09 MED ORDER — DOCUSATE SODIUM 100 MG PO CAPS
100.0000 mg | ORAL_CAPSULE | Freq: Two times a day (BID) | ORAL | Status: DC
  Administered 2015-02-09 – 2015-02-12 (×4): 100 mg via ORAL
  Filled 2015-02-09 (×8): qty 1

## 2015-02-09 MED ORDER — DILTIAZEM LOAD VIA INFUSION
10.0000 mg | Freq: Once | INTRAVENOUS | Status: DC
  Filled 2015-02-09: qty 10

## 2015-02-09 NOTE — Progress Notes (Signed)
Handoff report called to Carola Frost, Therapist, sports.  Patient transferring to room 1512 via wheelchair.

## 2015-02-09 NOTE — Progress Notes (Signed)
Poison Control called for labs and vital signs.  Poison Control recommendation is to discontinue acetylcysteine.

## 2015-02-09 NOTE — Progress Notes (Signed)
PROGRESS NOTE  Jacqueline Moyer MGQ:[id number] DOB: 1956/10/08 DOA: 02/07/2015 PCP: Gavin Pound, MD  Assessment/Plan: Intentional drug ingestion--acetaminophen - Based on caluclations from her pill bottles, patient could have taken as many as: 70 Percocet, 48 Morphine tabs, 15 Remeron tabs, 18 Meloxicam. -Repeat acetaminophen level--neg,  -INR--1.40,  -02/09/15--LFTs starting to improve--d/c N-acetylcysteine after discussion with poison control -consulted psychiatry--recommended inpt psychiatry care when stable -one-on-one sitter -hold all opoids and hypnotics Transaminasemia--drug induced hepatitis -Hepatitis B surface antigen negative -Hepatitis C antibody negative -HIV antibody negative -Appreciate GI consult -Continue N-acetylcysteine -Repeat LFTs, INR in 24 hours  -Abdominal ultrasound 2.5 cm left renal hypoechoic lesion--MRI abdomen when stable AKI -Volume depletion in the setting of NSAIDs  -Rehydrate-->improved -d/c meloxicam and ibuprofen Cellulitis legs -improved -venous duplex r/o DVT -blood cultures--negative to date -IV cefazolin--> improving  Abdominal pain -No bowel movement in 1 week -Start cathartics -Abdominal x-ray Hypokalemia  -Replete   -Check magnesium--1.8  Breast cancer  -Invasive mammary  -Follows Dr. Lindi Adie -Continue Arimidex  Leukocytosis  -Likely stress demargination and cellulitis legs--improving > -Chest x-ray negative for infiltrates  -UA--neg for pyruia -blood cultures--neg Hypothyroidism  -Continue Synthroid  -Check TSH--0.168  RBBB/dyspnea -not changed from prior EKGs -no CP -CXR  Family Communication: Pt at beside Disposition Plan: transfer to floor        Procedures/Studies: US Abdomen Complete  02/07/2015   CLINICAL DATA:  Elevated liver function tests. Prior cholecystectomy. Acute kidney injury. Personal history of colon carcinoma.  EXAM: ULTRASOUND ABDOMEN COMPLETE  COMPARISON:  CT on  11/20/2014  FINDINGS: Gallbladder: Surgically absent.  Common bile duct: Diameter: 4 mm, which is within normal limits  Liver: No focal lesion identified. Within normal limits in parenchymal echogenicity.  IVC: No abnormality visualized.  Pancreas: Visualized portion unremarkable.  Spleen: Size and appearance within normal limits.  Right Kidney: Length: 11.4 cm. Echogenicity within normal limits. No mass or hydronephrosis visualized.  Left Kidney: Length: 11.2 cm. Echogenicity within normal limits. No evidence hydronephrosis. A heterogeneous hypoechoic lesion with central calcification seen in the midpole of the left kidney. This measures 2.1 x 2.5 x 2.3 cm. Although this is unchanged in size since recent exam, a renal neoplasm cannot be excluded.  Abdominal aorta: No aneurysm visualized.  Other findings: None.  IMPRESSION: Previous cholecystectomy.  No hepatobiliary abnormality identified.  2.5 cm partially calcified hypoechoic lesion in the left kidney. Renal neoplasm cannot be excluded. Abdomen MRI without and with contrast is recommended for further characterization.   Electronically Signed   By: Earle Gell M.D.   On: 02/07/2015 17:12   Dg Chest Port 1 View  02/07/2015   CLINICAL DATA:  Overdose  EXAM: PORTABLE CHEST - 1 VIEW  COMPARISON:  02/07/2015  FINDINGS: A single AP portable view of the chest demonstrates no focal airspace consolidation or alveolar edema. The lungs are grossly clear. There is no large effusion or pneumothorax. There is unchanged mild cardiomegaly. Cardiac and mediastinal contours are otherwise unremarkable.  IMPRESSION: Mild cardiomegaly.  No acute findings.   Electronically Signed   By: Andreas Newport M.D.   On: 02/07/2015 06:06   Dg Abd Acute W/chest  02/07/2015   CLINICAL DATA:  Overdose.  EXAM: ACUTE ABDOMEN SERIES (ABDOMEN 2 VIEW & CHEST 1 VIEW)  COMPARISON:  11/19/2014  FINDINGS: There is no evidence of dilated bowel loops or free intraperitoneal air. No radiopaque calculi  or other significant radiographic abnormality is seen. Heart size and mediastinal  contours are within normal limits. Both lungs are clear.  IMPRESSION: Negative abdominal radiographs.  No acute cardiopulmonary disease.   Electronically Signed   By: Andreas Newport M.D.   On: 02/07/2015 04:07        Subjective:  patient complains of some dyspnea this morning with abdominal pain. No bowel movement in 1 week. She is passing flatus. Denies any vomiting, chest pain, coughing, hemoptysis, diarrhea, dysuria, hematuria. No fevers or chills.   Objective: Filed Vitals:   02/09/15 0740 02/09/15 0800 02/09/15 0900 02/09/15 0925  BP: 144/86     Pulse: 82 96 72   Temp:  98.2 F (36.8 C)    TempSrc:      Resp: 20 23 25    Height:      Weight:      SpO2: 100% 91% 98% 92%    Intake/Output Summary (Last 24 hours) at 02/09/15 1125 Last data filed at 02/09/15 0900  Gross per 24 hour  Intake   2197 ml  Output    550 ml  Net   1647 ml   Weight change:  Exam:   General:  Pt is alert, follows commands appropriately, not in acute distress  HEENT: No icterus, No thrush, [place]/AT  Cardiovascular: RRR, S1/S2, no rubs, no gallops  Respiratory: bibasilar crackles. Left with auscultation.   Abdomen: Soft/+BS, diffuse and abdominal pain without rebound,  non distended, no guarding  Extremities: No edema, No lymphangitis, No petechiae, No rashes, no synovitis  Data Reviewed: Basic Metabolic Panel:  Recent Labs Lab 02/07/15 0324 02/07/15 0337 02/07/15 1020 02/08/15 0403 02/09/15 0410  NA 134* 134*  --  139 142  K 2.8* 2.9*  --  2.3* 3.1*  CL 99 98  --  105 110  CO2 24  --   --  24 23  GLUCOSE 90 89  --  102* 120*  BUN 23 27*  --  12 8  CREATININE 1.32* 1.20*  --  0.57 0.42*  CALCIUM 8.5  --   --  7.8* 7.9*  MG  --   --  1.8  --  1.8   Liver Function Tests:  Recent Labs Lab 02/07/15 0324 02/08/15 0403 02/09/15 0410  AST 996* 1089* 248*  ALT 314* 623* 352*  ALKPHOS 139* 106 96    BILITOT 0.8 1.1 1.0  PROT 5.6* 4.6* 4.7*  ALBUMIN 2.9* 2.3* 2.2*   No results for input(s): LIPASE, AMYLASE in the last 168 hours. No results for input(s): AMMONIA in the last 168 hours. CBC:  Recent Labs Lab 02/07/15 0324 02/07/15 0337 02/08/15 0403 02/09/15 0410  WBC 16.9*  --  11.4* 10.0  HGB 10.8* 12.6 9.8* 9.8*  HCT 33.5* 37.0 29.8* 30.0*  MCV 88.4  --  86.1 87.5  PLT 510*  --  414* 421*   Cardiac Enzymes:  Recent Labs Lab 02/07/15 0324 02/08/15 0403  CKTOTAL 324* 122   BNP: Invalid input(s): POCBNP CBG: No results for input(s): GLUCAP in the last 168 hours.  Recent Results (from the past 240 hour(s))  MRSA PCR Screening     Status: None   Collection Time: 02/07/15 10:00 AM  Result Value Ref Range Status   MRSA by PCR NEGATIVE NEGATIVE Final    Comment:        The GeneXpert MRSA Assay (FDA approved for NASAL specimens only), is one component of a comprehensive MRSA colonization surveillance program. It is not intended to diagnose MRSA infection nor to guide or monitor treatment for  MRSA infections.   Culture, blood (routine x 2)     Status: None (Preliminary result)   Collection Time: 02/07/15 10:15 AM  Result Value Ref Range Status   Specimen Description BLOOD RIGHT ARM  Final   Special Requests BOTTLES DRAWN AEROBIC AND ANAEROBIC 5CC  Final   Culture   Final           BLOOD CULTURE RECEIVED NO GROWTH TO DATE CULTURE WILL BE HELD FOR 5 DAYS BEFORE ISSUING A FINAL NEGATIVE REPORT Performed at [facility]    Report Status PENDING  Incomplete  Culture, blood (routine x 2)     Status: None (Preliminary result)   Collection Time: 02/07/15 10:25 AM  Result Value Ref Range Status   Specimen Description BLOOD RIGHT ARM  Final   Special Requests BOTTLES DRAWN AEROBIC AND ANAEROBIC 3CC  Final   Culture   Final           BLOOD CULTURE RECEIVED NO GROWTH TO DATE CULTURE WILL BE HELD FOR 5 DAYS BEFORE ISSUING A FINAL NEGATIVE REPORT Note: Culture  results may be compromised due to an inadequate volume of blood received in culture bottles. Performed at [facility]    Report Status PENDING  Incomplete  Urine culture     Status: None   Collection Time: 02/07/15 11:17 AM  Result Value Ref Range Status   Specimen Description URINE, CLEAN CATCH  Final   Special Requests NONE  Final   Colony Count   Final    50,000 COLONIES/ML Performed at [facility]    Culture   Final    Multiple bacterial morphotypes present, none predominant. Suggest appropriate recollection if clinically indicated. Performed at [facility]    Report Status 02/08/2015 FINAL  Final     Scheduled Meds: . anastrozole  1 mg Oral Daily  . bisacodyl  10 mg Rectal Once  .  ceFAZolin (ANCEF) IV  1 g Intravenous 3 times per day  . docusate sodium  100 mg Oral BID  . enoxaparin (LOVENOX) injection  40 mg Subcutaneous Q24H  . levothyroxine  150 mcg Oral QAC breakfast  . potassium chloride  40 mEq Oral Once  . sodium chloride  2,000 mL Intravenous Once  . sodium chloride  3 mL Intravenous Q12H   Continuous Infusions: . sodium chloride 0.9 % 1,000 mL with potassium chloride 20 mEq infusion 75 mL/hr at 02/07/15 1153     Tennie Grussing, DO  [facility] Pager [phone]  If 7PM-7AM, please contact night-coverage [email or web address] Password [email or web address] 02/09/2015, 11:25 AM   LOS: 2 days

## 2015-02-09 NOTE — Progress Notes (Signed)
Report from day shift that pt has periods of tachycardia wide complex. During the night pt continued irregular wide complex tachycardia some lasting 35 beats. Pt continues to be asymptomatic and denies any chest pain or shortness of breath during these times. Examples placed on chart.

## 2015-02-09 NOTE — Progress Notes (Signed)
[facility] Progress Note  Subjective:  Feels ok.  Still has abdominal pain. Mostly RUQ.  Does not talk much.  Objective:  Vital signs in last 24 hours: Temp:  [97.6 F (36.4 C)-98.8 F (37.1 C)] 98.2 F (36.8 C) (03/18 0800) Pulse Rate:  [55-96] 96 (03/18 0800) Resp:  [16-23] 23 (03/18 0800) BP: (134-153)/(56-86) 144/86 mmHg (03/18 0740) SpO2:  [89 %-100 %] 91 % (03/18 0800) Last BM Date: 02/02/15 (Per patient report) General:  Alert, Well-developed, in NAD Heart:  Regular rate and rhythm Pulm:  CTAB.   Abdomen:  Soft, non-distended. Normal bowel sounds present.  Moderate RUQ TTP.  Extremities:  Without edema. Neurologic:  Alert and  oriented x 4;  grossly normal neurologically.  Intake/Output from previous day: 03/17 0701 - 03/18 0700 In: 2252 [P.O.:240; I.V.:1462; IV Piggyback:550] Out: 250 [Urine:250] Intake/Output this shift: Total I/O In: 120 [P.O.:120] Out: 300 [Urine:300]  Lab Results:  Recent Labs  02/07/15 0324 02/07/15 0337 02/08/15 0403 02/09/15 0410  WBC 16.9*  --  11.4* 10.0  HGB 10.8* 12.6 9.8* 9.8*  HCT 33.5* 37.0 29.8* 30.0*  PLT 510*  --  414* 421*   BMET  Recent Labs  02/07/15 0324 02/07/15 0337 02/08/15 0403 02/09/15 0410  NA 134* 134* 139 142  K 2.8* 2.9* 2.3* 3.1*  CL 99 98 105 110  CO2 24  --  24 23  GLUCOSE 90 89 102* 120*  BUN 23 27* 12 8  CREATININE 1.32* 1.20* 0.57 0.42*  CALCIUM 8.5  --  7.8* 7.9*   LFT  Recent Labs  02/09/15 0410  PROT 4.7*  ALBUMIN 2.2*  AST 248*  ALT 352*  ALKPHOS 96  BILITOT 1.0   PT/INR  Recent Labs  02/08/15 0403 02/09/15 0410  LABPROT 17.3* 15.2  INR 1.40 1.19   Hepatitis Panel  Recent Labs  02/07/15 1514  HEPBSAG NEGATIVE  HCVAB NEGATIVE   US Abdomen Complete  02/07/2015   CLINICAL DATA:  Elevated liver function tests. Prior cholecystectomy. Acute kidney injury. Personal history of colon carcinoma.  EXAM: ULTRASOUND ABDOMEN COMPLETE  COMPARISON:  CT on  11/20/2014  FINDINGS: Gallbladder: Surgically absent.  Common bile duct: Diameter: 4 mm, which is within normal limits  Liver: No focal lesion identified. Within normal limits in parenchymal echogenicity.  IVC: No abnormality visualized.  Pancreas: Visualized portion unremarkable.  Spleen: Size and appearance within normal limits.  Right Kidney: Length: 11.4 cm. Echogenicity within normal limits. No mass or hydronephrosis visualized.  Left Kidney: Length: 11.2 cm. Echogenicity within normal limits. No evidence hydronephrosis. A heterogeneous hypoechoic lesion with central calcification seen in the midpole of the left kidney. This measures 2.1 x 2.5 x 2.3 cm. Although this is unchanged in size since recent exam, a renal neoplasm cannot be excluded.  Abdominal aorta: No aneurysm visualized.  Other findings: None.  IMPRESSION: Previous cholecystectomy.  No hepatobiliary abnormality identified.  2.5 cm partially calcified hypoechoic lesion in the left kidney. Renal neoplasm cannot be excluded. Abdomen MRI without and with contrast is recommended for further characterization.   Electronically Signed   By: Earle Gell M.D.   On: 02/07/2015 17:12    Assessment / Plan: -Acute toxic hepatitis secondary to intentional overdose of Percocet along with several other medications (she estimates that she possibly took close to 100 pills). NAC protocol started upon admission.  LFT's much improved this AM and INR remains normal.  Continue to monitor those parameters as well as mental status  exam.  NAC discontinued as per recommendations from poison control.   LOS: 2 days   ZEHR, JESSICA D.  02/09/2015, 8:54 AM  Pager number [phone]   GI ATTENDING  Agree with above. Hepatic synthetic function remains normal.Continue supportive care and psychiatic care. Call for questions. Will sign off  John N. Geri [place]in[place]., M.D. [facility] Division of Gastroenterology

## 2015-02-09 NOTE — Clinical Social Work Psych Assess (Signed)
Clinical Social Work Department CLINICAL SOCIAL WORK PSYCHIATRY SERVICE LINE ASSESSMENT 02/09/2015  Patient:  Jacqueline Moyer  Account:  [id number]  [place] Date:  02/07/2015  Clinical Social Worker:  Daiva Huge  Date/Time:  02/09/2015 02:26 PM Referred by:  Physician  Date referred:  02/09/2015 Reason for Referral  Behavioral Health Issues   Presenting Symptoms/Problems (In the person's/family's own words):   She admits to trying to "end my life" . She also reports that she was just tired of living and, "only going to get worse". Patient was limited due to her level of alertness- she appeared to almost fall asleep a few times.   Abuse/Neglect/Trauma History (check all that apply)  Denies history   Abuse/Neglect/Trauma Comments:   Psychiatric History (check all that apply)  Inpatient/hospitilization   Psychiatric medications:  see MAR   Current Mental Health Hospitalizations/Previous Mental Health History:   Current provider:   Place and Date:   Current Medications:   Previous Impatient Admission/Date/Reason:   Emotional Health / Current Symptoms    Suicide/Self Harm  Suicide attempt in past (date/description)   Suicide attempt in the past:   Admitted after suicide attempt- 02/07/2015   Other harmful behavior:   Psychotic/Dissociative Symptoms  None reported   Other Psychotic/Dissociative Symptoms:    Attention/Behavioral Symptoms  Withdrawn   Other Attention / Behavioral Symptoms:    Cognitive Impairment  Within Normal Limits   Other Cognitive Impairment:    Mood and Adjustment  DEPRESSION  Flat    Stress, Anxiety, Trauma, Any Recent Loss/Stressor  Anxiety  Relationship   Anxiety (frequency):   often- due to relationships and her medical diagnosis   Phobia (specify):   denied   Compulsive behavior (specify):   denied   Obsessive behavior (specify):   denied   Other:   Patient reports she and her boyfriend have been struggling- she  indicates they have been living together for 8 years. She has a daughter in [place] whom she saw recently but has minimal contact with per her report.  Patient expressed that she has cancer and its "only a matter of time"   Substance Abuse/Use  None   SBIRT completed (please refer for detailed history):    Self-reported substance use:   denies   Urinary Drug Screen Completed:  Y Alcohol level:   <5    Environmental/Housing/Living Arrangement  With Other Relatives   Who is in the home:   Jacqueline Moyer- her boyfriend of 8 years.   Emergency contact:  she denies anyone for this role- she did give CSW permission to call Jacqueline Moyer- her boyfriend whom she lived with for 8 years.   Financial  Medicare  Social Security Disability Income   Patient's Strengths and Goals (patient's own words):   She is no longer suicidal. She is asking me to seek information about her car and to get some of her clothes/belongings here.   Clinical Social Worker's Interpretive Summary:   Patient appears more alert today- she is more interactive and engaged during my visit. She understands MD recommendation for inpatient treatment and agrees to this plan.   Disposition:  Inpatient referral made [facility], [facility], [place]) Jacqueline Moyer, MSW, [place]

## 2015-02-10 ENCOUNTER — Inpatient Hospital Stay (HOSPITAL_COMMUNITY): Payer: Medicare Other

## 2015-02-10 LAB — COMPREHENSIVE METABOLIC PANEL
ALK PHOS: 95 U/L (ref 39–117)
ALT: 199 U/L — ABNORMAL HIGH (ref 0–35)
ANION GAP: 8 (ref 5–15)
AST: 68 U/L — AB (ref 0–37)
Albumin: 2.1 g/dL — ABNORMAL LOW (ref 3.5–5.2)
BUN: <5 mg/dL — ABNORMAL LOW (ref 6–23)
CO2: 24 mmol/L (ref 19–32)
CREATININE: 0.37 mg/dL — AB (ref 0.50–1.10)
Calcium: 8 mg/dL — ABNORMAL LOW (ref 8.4–10.5)
Chloride: 109 mmol/L (ref 96–112)
GFR calc Af Amer: >90 mL/min (ref >90)
GFR calc non Af Amer: >90 mL/min (ref >90)
Glucose, Bld: 103 mg/dL — ABNORMAL HIGH (ref 70–99)
Potassium: 3.1 mmol/L — ABNORMAL LOW (ref 3.5–5.1)
SODIUM: 141 mmol/L (ref 135–145)
TOTAL PROTEIN: 4.8 g/dL — AB (ref 6.0–8.3)
Total Bilirubin: 0.8 mg/dL (ref 0.3–1.2)

## 2015-02-10 LAB — CBC
HCT: 29.6 % — ABNORMAL LOW (ref 36.0–46.0)
HEMOGLOBIN: 9.6 g/dL — AB (ref 12.0–15.0)
MCH: 28.8 pg (ref 26.0–34.0)
MCHC: 32.4 g/dL (ref 30.0–36.0)
MCV: 88.9 fL (ref 78.0–100.0)
Platelets: 411 10*3/uL — ABNORMAL HIGH (ref 150–400)
RBC: 3.33 MIL/uL — AB (ref 3.87–5.11)
RDW: 17.8 % — AB (ref 11.5–15.5)
WBC: 8 10*3/uL (ref 4.0–10.5)

## 2015-02-10 MED ORDER — GADOBENATE DIMEGLUMINE 529 MG/ML IV SOLN
15.0000 mL | Freq: Once | INTRAVENOUS | Status: AC | PRN
  Administered 2015-02-10: 15 mL via INTRAVENOUS

## 2015-02-10 MED ORDER — LORAZEPAM 2 MG/ML IJ SOLN
1.0000 mg | Freq: Once | INTRAMUSCULAR | Status: AC
  Administered 2015-02-10: 1 mg via INTRAVENOUS
  Filled 2015-02-10: qty 1

## 2015-02-10 MED ORDER — POTASSIUM CHLORIDE CRYS ER 20 MEQ PO TBCR
40.0000 meq | EXTENDED_RELEASE_TABLET | Freq: Once | ORAL | Status: AC
  Administered 2015-02-10: 40 meq via ORAL
  Filled 2015-02-10: qty 2

## 2015-02-10 MED ORDER — POTASSIUM CHLORIDE 10 MEQ/100ML IV SOLN
10.0000 meq | INTRAVENOUS | Status: AC
  Administered 2015-02-10: 10 meq via INTRAVENOUS
  Filled 2015-02-10 (×2): qty 100

## 2015-02-10 NOTE — Progress Notes (Signed)
CSW unable to follow up on previous behavioral referrals made as unable to decipher from note where patient was referred to.  Therefore, CSW will make new referrals.    Pending: [facility]  Other facilities are at capacity.   CSW will follow up.  Hillery Hunter, LCSW Disposition Social Worker [phone]

## 2015-02-10 NOTE — Progress Notes (Signed)
PROGRESS NOTE  Tru Leopard XLK:[id number] DOB: [birth year] DOA: 02/07/2015 PCP: Gavin Pound, MD  Assessment/Plan: Intentional drug ingestion--acetaminophen - Based on caluclations from her pill bottles, patient could have taken as many as: 10 Percocet, 48 Morphine tabs, 15 Remeron tabs, 18 Meloxicam. -Repeat acetaminophen level--neg -INR--1.40-->1.19 -02/09/15--LFTs starting to improve--d/c N-acetylcysteine after discussion with poison control -consulted psychiatry--recommended inpt psychiatry care when stable -one-on-one sitter -hold all opoids and hypnotics Transaminasemia--drug induced hepatitis -Hepatitis B surface antigen negative -Hepatitis C antibody negative -HIV antibody negative -Appreciate GI consult -stopped N-acetylcysteine on 3/18 -LFTs continue to improve -Repeat LFTs, INR in 24 hours  -Abdominal ultrasound 2.5 cm left renal hypoechoic lesion--MRI abdomen when stable AKI -Volume depletion in the setting of NSAIDs  -Rehydrate-->improved -d/c meloxicam and ibuprofen Cellulitis legs -improved -venous duplex r/o DVT--neg -blood cultures--negative to date -IV cefazolin--> improving  Abdominal pain -No bowel movement in 1 week -Started cathartics -Abdominal x-ray--ileus type pattern -02/10/15-- pt c/o 6 loose stools in past 24 hrs -C diff PCR -hold stool softeners Hypokalemia  -Replete   -Check magnesium--1.8  Breast cancer  -Invasive mammary  -Follows Dr. Lindi Adie -Continue Arimidex  Leukocytosis  -Likely stress demargination and cellulitis legs--improving > -Chest x-ray negative for infiltrates  -UA--neg for pyruia -blood cultures--neg Hypothyroidism  -Continue Synthroid  -Check TSH--0.168  RBBB/dyspnea -not changed from prior EKGs -no CP -CXR--RLL opacity-->likely atelectasis, clinically does not have PNA--sob now better  Family Communication:   Pt at beside Disposition Plan:   Home when medically  stable       Procedures/Studies: US Abdomen Complete  02/07/2015   CLINICAL DATA:  Elevated liver function tests. Prior cholecystectomy. Acute kidney injury. Personal history of colon carcinoma.  EXAM: ULTRASOUND ABDOMEN COMPLETE  COMPARISON:  CT on 11/20/2014  FINDINGS: Gallbladder: Surgically absent.  Common bile duct: Diameter: 4 mm, which is within normal limits  Liver: No focal lesion identified. Within normal limits in parenchymal echogenicity.  IVC: No abnormality visualized.  Pancreas: Visualized portion unremarkable.  Spleen: Size and appearance within normal limits.  Right Kidney: Length: 11.4 cm. Echogenicity within normal limits. No mass or hydronephrosis visualized.  Left Kidney: Length: 11.2 cm. Echogenicity within normal limits. No evidence hydronephrosis. A heterogeneous hypoechoic lesion with central calcification seen in the midpole of the left kidney. This measures 2.1 x 2.5 x 2.3 cm. Although this is unchanged in size since recent exam, a renal neoplasm cannot be excluded.  Abdominal aorta: No aneurysm visualized.  Other findings: None.  IMPRESSION: Previous cholecystectomy.  No hepatobiliary abnormality identified.  2.5 cm partially calcified hypoechoic lesion in the left kidney. Renal neoplasm cannot be excluded. Abdomen MRI without and with contrast is recommended for further characterization.   Electronically Signed   By: Earle Gell M.D.   On: 02/07/2015 17:12   Dg Chest Port 1 View  02/09/2015   CLINICAL DATA:  Dyspnea  EXAM: PORTABLE CHEST - 1 VIEW  COMPARISON:  02/07/2015  FINDINGS: Mild cardiac enlargement without heart failure. Possible early infiltrate or atelectasis the right lung base. Left lung is clear. No effusion.  IMPRESSION: Possible early pneumonia right lower lobe.   Electronically Signed   By: Franchot Gallo M.D.   On: 02/09/2015 12:02   Dg Chest Port 1 View  02/07/2015   CLINICAL DATA:  Overdose  EXAM: PORTABLE CHEST - 1 VIEW  COMPARISON:  02/07/2015   FINDINGS: A single AP portable view of the chest demonstrates no focal airspace consolidation or  alveolar edema. The lungs are grossly clear. There is no large effusion or pneumothorax. There is unchanged mild cardiomegaly. Cardiac and mediastinal contours are otherwise unremarkable.  IMPRESSION: Mild cardiomegaly.  No acute findings.   Electronically Signed   By: Andreas Newport M.D.   On: 02/07/2015 06:06   Dg Abd Acute W/chest  02/07/2015   CLINICAL DATA:  Overdose.  EXAM: ACUTE ABDOMEN SERIES (ABDOMEN 2 VIEW & CHEST 1 VIEW)  COMPARISON:  11/19/2014  FINDINGS: There is no evidence of dilated bowel loops or free intraperitoneal air. No radiopaque calculi or other significant radiographic abnormality is seen. Heart size and mediastinal contours are within normal limits. Both lungs are clear.  IMPRESSION: Negative abdominal radiographs.  No acute cardiopulmonary disease.   Electronically Signed   By: Andreas Newport M.D.   On: 02/07/2015 04:07   Dg Abd Portable 2v  02/09/2015   CLINICAL DATA:  Abdominal pain  EXAM: PORTABLE ABDOMEN - 2 VIEW  COMPARISON:  02/07/2015  FINDINGS: Prior ventral hernia repair with mesh. Negative for bowel obstruction. Large amount of bowel gas in the rectum. Gas is present in the right and transverse colon. Gas is present in the small bowel. Findings most consistent with adynamic ileus. No free air on the decubitus view.  IMPRESSION: Adynamic ileus with progressive bowel gas compared with the prior study.   Electronically Signed   By: Franchot Gallo M.D.   On: 02/09/2015 12:03         Subjective: Patient denies any fevers, chills, chest pain, short of breath, coughing, hemoptysis vomiting. She has had 6 loose stools since the last 24 hours. Denies any dysuria, hematuria.  Objective: Filed Vitals:   02/09/15 1620 02/09/15 1711 02/09/15 2147 02/10/15 0613  BP: 133/53 143/88 139/69 131/81  Pulse: 76 67 80 74  Temp:  98.7 F (37.1 C) 99.2 F (37.3 C) 98.7 F (37.1  C)  TempSrc:   Oral Oral  Resp: 20 18 16 16   Height:      Weight:      SpO2: 94% 95% 94% 92%    Intake/Output Summary (Last 24 hours) at 02/10/15 1506 Last data filed at 02/10/15 1401  Gross per 24 hour  Intake    585 ml  Output    653 ml  Net    -68 ml   Weight change:  Exam:   General:  Pt is alert, follows commands appropriately, not in acute distress  HEENT: No icterus, No thrush,[place]/AT  Cardiovascular: RRR, S1/S2, no rubs, no gallops  Respiratory: CTA bilaterally, no wheezing, no crackles, no rhonchi  Abdomen: Soft/+BS, diffusely tender. No guarding, no rebound, non distended, no guarding  Extremities: 1+LE edema, No lymphangitis, No petechiae, No rashes, no synovitis  Data Reviewed: Basic Metabolic Panel:  Recent Labs Lab 02/07/15 0324 02/07/15 0337 02/07/15 1020 02/08/15 0403 02/09/15 0410 02/10/15 0550  NA 134* 134*  --  139 142 141  K 2.8* 2.9*  --  2.3* 3.1* 3.1*  CL 99 98  --  105 110 109  CO2 24  --   --  24 23 24   GLUCOSE 90 89  --  102* 120* 103*  BUN 23 27*  --  12 8 <5*  CREATININE 1.32* 1.20*  --  0.57 0.42* 0.37*  CALCIUM 8.5  --   --  7.8* 7.9* 8.0*  MG  --   --  1.8  --  1.8  --    Liver Function Tests:  Recent Labs Lab 02/07/15  3235 02/08/15 0403 02/09/15 0410 02/10/15 0550  AST 996* 1089* 248* 68*  ALT 314* 623* 352* 199*  ALKPHOS 139* 106 96 95  BILITOT 0.8 1.1 1.0 0.8  PROT 5.6* 4.6* 4.7* 4.8*  ALBUMIN 2.9* 2.3* 2.2* 2.1*   No results for input(s): LIPASE, AMYLASE in the last 168 hours. No results for input(s): AMMONIA in the last 168 hours. CBC:  Recent Labs Lab 02/07/15 0324 02/07/15 0337 02/08/15 0403 02/09/15 0410 02/10/15 0550  WBC 16.9*  --  11.4* 10.0 8.0  HGB 10.8* 12.6 9.8* 9.8* 9.6*  HCT 33.5* 37.0 29.8* 30.0* 29.6*  MCV 88.4  --  86.1 87.5 88.9  PLT 510*  --  414* 421* 411*   Cardiac Enzymes:  Recent Labs Lab 02/07/15 0324 02/08/15 0403  CKTOTAL 324* 122   BNP: Invalid input(s):  POCBNP CBG: No results for input(s): GLUCAP in the last 168 hours.  Recent Results (from the past 240 hour(s))  MRSA PCR Screening     Status: None   Collection Time: 02/07/15 10:00 AM  Result Value Ref Range Status   MRSA by PCR NEGATIVE NEGATIVE Final    Comment:        The GeneXpert MRSA Assay (FDA approved for NASAL specimens only), is one component of a comprehensive MRSA colonization surveillance program. It is not intended to diagnose MRSA infection nor to guide or monitor treatment for MRSA infections.   Culture, blood (routine x 2)     Status: None (Preliminary result)   Collection Time: 02/07/15 10:15 AM  Result Value Ref Range Status   Specimen Description BLOOD RIGHT ARM  Final   Special Requests BOTTLES DRAWN AEROBIC AND ANAEROBIC 5CC  Final   Culture   Final           BLOOD CULTURE RECEIVED NO GROWTH TO DATE CULTURE WILL BE HELD FOR 5 DAYS BEFORE ISSUING A FINAL NEGATIVE REPORT Performed at [facility]    Report Status PENDING  Incomplete  Culture, blood (routine x 2)     Status: None (Preliminary result)   Collection Time: 02/07/15 10:25 AM  Result Value Ref Range Status   Specimen Description BLOOD RIGHT ARM  Final   Special Requests BOTTLES DRAWN AEROBIC AND ANAEROBIC 3CC  Final   Culture   Final           BLOOD CULTURE RECEIVED NO GROWTH TO DATE CULTURE WILL BE HELD FOR 5 DAYS BEFORE ISSUING A FINAL NEGATIVE REPORT Note: Culture results may be compromised due to an inadequate volume of blood received in culture bottles. Performed at [facility]    Report Status PENDING  Incomplete  Urine culture     Status: None   Collection Time: 02/07/15 11:17 AM  Result Value Ref Range Status   Specimen Description URINE, CLEAN CATCH  Final   Special Requests NONE  Final   Colony Count   Final    50,000 COLONIES/ML Performed at [facility]    Culture   Final    Multiple bacterial morphotypes present, none predominant. Suggest  appropriate recollection if clinically indicated. Performed at [facility]    Report Status 02/08/2015 FINAL  Final     Scheduled Meds: . anastrozole  1 mg Oral Daily  .  ceFAZolin (ANCEF) IV  1 g Intravenous 3 times per day  . docusate sodium  100 mg Oral BID  . enoxaparin (LOVENOX) injection  40 mg Subcutaneous Q24H  . levothyroxine  150 mcg Oral  QAC breakfast  . sodium chloride  3 mL Intravenous Q12H   Continuous Infusions: . sodium chloride 0.9 % 1,000 mL with potassium chloride 20 mEq infusion 75 mL/hr at 02/07/15 1153     Crosby Bevan, DO  [facility] Pager [phone]  If 7PM-7AM, please contact night-coverage [email or web address] Password [email or web address] 02/10/2015, 3:06 PM   LOS: 3 days

## 2015-02-10 NOTE — BHH Counselor (Signed)
Received call from staff at [facility] stating Pt has been declined by Dr. Doristine Johns due to medical acuity.  Orpah Greek Rosana Hoes, [facility] Triage Specialist [phone]

## 2015-02-10 NOTE — BH Assessment (Signed)
[facility] Assessment Progress Note   Pt's referral faxed to the following facilities with bed availability:  [facility] [facility] will continue to seek placement for the pt.  Shaune Pascal, MS, [facility] Therapeutic Triage Specialist [facility]

## 2015-02-10 NOTE — Progress Notes (Signed)
PT Cancellation Note  Patient Details Name: Jacqueline Moyer MRN: [id number] DOB: [birth year]   Cancelled Treatment:     PT eval attempted x 3 but deferred 2* pt sleeping, eating and at final attempt requesting PT deferred until tomorrow.  Will follow.   Auren Valdes 02/10/2015, 2:34 PM

## 2015-02-11 LAB — COMPREHENSIVE METABOLIC PANEL
ALT: 116 U/L — ABNORMAL HIGH (ref 0–35)
AST: 29 U/L (ref 0–37)
Albumin: 2.3 g/dL — ABNORMAL LOW (ref 3.5–5.2)
Alkaline Phosphatase: 92 U/L (ref 39–117)
Anion gap: 10 (ref 5–15)
BUN: <5 mg/dL — ABNORMAL LOW (ref 6–23)
CALCIUM: 8 mg/dL — AB (ref 8.4–10.5)
CHLORIDE: 106 mmol/L (ref 96–112)
CO2: 24 mmol/L (ref 19–32)
Creatinine, Ser: 0.39 mg/dL — ABNORMAL LOW (ref 0.50–1.10)
GFR calc Af Amer: >90 mL/min (ref >90)
GLUCOSE: 97 mg/dL (ref 70–99)
POTASSIUM: 2.9 mmol/L — AB (ref 3.5–5.1)
SODIUM: 140 mmol/L (ref 135–145)
TOTAL PROTEIN: 4.8 g/dL — AB (ref 6.0–8.3)
Total Bilirubin: 0.8 mg/dL (ref 0.3–1.2)

## 2015-02-11 LAB — CLOSTRIDIUM DIFFICILE BY PCR: CDIFFPCR: NEGATIVE

## 2015-02-11 MED ORDER — POTASSIUM CHLORIDE CRYS ER 20 MEQ PO TBCR
40.0000 meq | EXTENDED_RELEASE_TABLET | Freq: Once | ORAL | Status: AC
  Administered 2015-02-11: 40 meq via ORAL
  Filled 2015-02-11: qty 2

## 2015-02-11 MED ORDER — PANTOPRAZOLE SODIUM 40 MG PO TBEC
40.0000 mg | DELAYED_RELEASE_TABLET | Freq: Every day | ORAL | Status: DC
  Administered 2015-02-11 – 2015-02-12 (×2): 40 mg via ORAL
  Filled 2015-02-11 (×2): qty 1

## 2015-02-11 MED ORDER — POTASSIUM CHLORIDE 10 MEQ/100ML IV SOLN
10.0000 meq | INTRAVENOUS | Status: AC
  Administered 2015-02-11 (×3): 10 meq via INTRAVENOUS
  Filled 2015-02-11 (×3): qty 100

## 2015-02-11 MED ORDER — OXYCODONE HCL 5 MG PO TABS
5.0000 mg | ORAL_TABLET | ORAL | Status: DC | PRN
  Administered 2015-02-11 – 2015-02-12 (×7): 5 mg via ORAL
  Filled 2015-02-11 (×7): qty 1

## 2015-02-11 NOTE — Progress Notes (Signed)
PROGRESS NOTE  Jacqueline Moyer ZOX:[id number] DOB: 01-19-1956 DOA: 02/07/2015 PCP: Gavin Pound, MD  Assessment/Plan: Intentional drug ingestion--acetaminophen - Based on caluclations from her pill bottles, patient could have taken as many as: 70 Percocet, 48 Morphine tabs, 15 Remeron tabs, 18 Meloxicam. -Repeat acetaminophen level--neg -INR--1.40-->1.19 -02/09/15--LFTs starting to improve--d/c N-acetylcysteine after discussion with poison control -consulted psychiatry--recommended inpt psychiatry care when stable -one-on-one sitter -held all opoids and hypnotics -02/11/15--restart low dose OxyIR 5mg  q 6hr due to pain from spinal stenosis -02/11/15--clinically improving, LFTs near normal Transaminasemia--drug induced hepatitis -Hepatitis B surface antigen negative -Hepatitis C antibody negative -HIV antibody negative -Appreciate GI consult -stopped N-acetylcysteine on 3/18 -LFTs continue to improve -Repeat LFTs, INR in 24 hours  -Abdominal ultrasound 2.5 cm left renal hypoechoic lesion--MRI abdomen when stable AKI -Volume depletion in the setting of NSAIDs  -Rehydrate-->improved -d/c meloxicam and ibuprofen Cellulitis legs -improved -venous duplex r/o DVT--neg -blood cultures--negative to date -IV cefazolin--> improving  -plan to d/c with po cephalexin Abdominal pain -No bowel movement in 1 week -Started cathartics -Abdominal x-ray--ileus type pattern -02/10/15-- pt c/o 6 loose stools in past 24 hrs -C diff PCR--neg -hold stool softeners -02/11/15--stools improving, abd less tender, tolerating diet -start PPI Hypokalemia  -Replete   -Check magnesium--1.8  Breast cancer  -Invasive mammary  -Follows Dr. Lindi Adie -Continue Arimidex  Leukocytosis  -Likely stress demargination and cellulitis legs--improved -Chest x-ray negative for infiltrates  -UA--neg for pyruia -blood cultures--neg Hypothyroidism  -Continue Synthroid  -Check TSH--0.168   RBBB/dyspnea -not changed from prior EKGs -no CP -CXR--RLL opacity-->likely atelectasis, clinically does not have PNA--sob now better  Family Communication: Pt at beside Disposition Plan: behavior health facilty--PATIENT IS MEDICALLY STABLE             Procedures/Studies: Mr Abdomen W Wo Contrast  02/11/2015   CLINICAL DATA:  Overdose, renal injury. Indeterminate lesion on ultrasound. Elevated LFTs. History of colon carcinoma.  EXAM: MRI ABDOMEN WITHOUT AND WITH CONTRAST  TECHNIQUE: Multiplanar multisequence MR imaging of the abdomen was performed both before and after the administration of intravenous contrast.  CONTRAST:  65mL MULTIHANCE GADOBENATE DIMEGLUMINE 529 MG/ML IV SOLN  COMPARISON:  Ultrasound 02/07/2015, CT 11/12/2014  FINDINGS: Lower chest:  Lung bases are clear.  Hepatobiliary: Liver is normal parenchymal intensity. No hepatic steatosis. No biliary duct dilatation. No enhancing hepatic lesion.  Pancreas: Normal pancreas.  No duct dilatation or mass.  Spleen: Normal spleen  Adrenals/urinary tract: Adrenal glands are normal. Lesion of concern in the left kidney measures 22 mm in axial dimension (image 37, series 1002. This lesion has no post-contrast enhancement. The calcific central portion of lesion is not well demonstrated on MRI. There is fluid signal intensity within the peripheral aspect of the lesion (image 23, series 4.  Stomach/Bowel: Stomach limited view of the bowel is unremarkable.  Vascular/Lymphatic: No retroperitoneal lymphadenopathy.  Musculoskeletal: No aggressive osseous lesion.  IMPRESSION: 1. Nonenhancing cystic lesion of the left kidney with central calcification is felt to be a benign lesion. 2. No hepatic parenchymal abnormality.   Electronically Signed   By: Suzy Bouchard M.D.   On: 02/11/2015 10:12   US Abdomen Complete  02/07/2015   CLINICAL DATA:  Elevated liver function tests. Prior cholecystectomy. Acute kidney injury. Personal history of  colon carcinoma.  EXAM: ULTRASOUND ABDOMEN COMPLETE  COMPARISON:  CT on 11/20/2014  FINDINGS: Gallbladder: Surgically absent.  Common bile duct: Diameter: 4 mm, which is within normal limits  Liver: No  focal lesion identified. Within normal limits in parenchymal echogenicity.  IVC: No abnormality visualized.  Pancreas: Visualized portion unremarkable.  Spleen: Size and appearance within normal limits.  Right Kidney: Length: 11.4 cm. Echogenicity within normal limits. No mass or hydronephrosis visualized.  Left Kidney: Length: 11.2 cm. Echogenicity within normal limits. No evidence hydronephrosis. A heterogeneous hypoechoic lesion with central calcification seen in the midpole of the left kidney. This measures 2.1 x 2.5 x 2.3 cm. Although this is unchanged in size since recent exam, a renal neoplasm cannot be excluded.  Abdominal aorta: No aneurysm visualized.  Other findings: None.  IMPRESSION: Previous cholecystectomy.  No hepatobiliary abnormality identified.  2.5 cm partially calcified hypoechoic lesion in the left kidney. Renal neoplasm cannot be excluded. Abdomen MRI without and with contrast is recommended for further characterization.   Electronically Signed   By: Earle Gell M.D.   On: 02/07/2015 17:12   Dg Chest Port 1 View  02/09/2015   CLINICAL DATA:  Dyspnea  EXAM: PORTABLE CHEST - 1 VIEW  COMPARISON:  02/07/2015  FINDINGS: Mild cardiac enlargement without heart failure. Possible early infiltrate or atelectasis the right lung base. Left lung is clear. No effusion.  IMPRESSION: Possible early pneumonia right lower lobe.   Electronically Signed   By: Franchot Gallo M.D.   On: 02/09/2015 12:02   Dg Chest Port 1 View  02/07/2015   CLINICAL DATA:  Overdose  EXAM: PORTABLE CHEST - 1 VIEW  COMPARISON:  02/07/2015  FINDINGS: A single AP portable view of the chest demonstrates no focal airspace consolidation or alveolar edema. The lungs are grossly clear. There is no large effusion or pneumothorax. There is  unchanged mild cardiomegaly. Cardiac and mediastinal contours are otherwise unremarkable.  IMPRESSION: Mild cardiomegaly.  No acute findings.   Electronically Signed   By: Andreas Newport M.D.   On: 02/07/2015 06:06   Dg Abd Acute W/chest  02/07/2015   CLINICAL DATA:  Overdose.  EXAM: ACUTE ABDOMEN SERIES (ABDOMEN 2 VIEW & CHEST 1 VIEW)  COMPARISON:  11/19/2014  FINDINGS: There is no evidence of dilated bowel loops or free intraperitoneal air. No radiopaque calculi or other significant radiographic abnormality is seen. Heart size and mediastinal contours are within normal limits. Both lungs are clear.  IMPRESSION: Negative abdominal radiographs.  No acute cardiopulmonary disease.   Electronically Signed   By: Andreas Newport M.D.   On: 02/07/2015 04:07   Dg Abd Portable 2v  02/09/2015   CLINICAL DATA:  Abdominal pain  EXAM: PORTABLE ABDOMEN - 2 VIEW  COMPARISON:  02/07/2015  FINDINGS: Prior ventral hernia repair with mesh. Negative for bowel obstruction. Large amount of bowel gas in the rectum. Gas is present in the right and transverse colon. Gas is present in the small bowel. Findings most consistent with adynamic ileus. No free air on the decubitus view.  IMPRESSION: Adynamic ileus with progressive bowel gas compared with the prior study.   Electronically Signed   By: Franchot Gallo M.D.   On: 02/09/2015 12:03         Subjective: Patient is feeling better today. Abdominal pain is improved. She still has intermittent nausea without emesis. Denies any fevers, chills, chest pain, shortness breath, vomiting, dysuria, hematuria. Diarrhea has improved.  Objective: Filed Vitals:   02/10/15 0613 02/11/15 0611 02/11/15 0710 02/11/15 1346  BP: 131/81 130/89  147/76  Pulse: 74 86  72  Temp: 98.7 F (37.1 C)  98.3 F (36.8 C) 97.7 F (36.5 C)  TempSrc:  Oral  Oral Oral  Resp: 16   18  Height:      Weight:      SpO2: 92% 95%  96%    Intake/Output Summary (Last 24 hours) at 02/11/15  1650 Last data filed at 02/11/15 0816  Gross per 24 hour  Intake   2735 ml  Output      0 ml  Net   2735 ml   Weight change:  Exam:   General:  Pt is alert, follows commands appropriately, not in acute distress  HEENT: No icterus, No thrush,  [place]/AT  Cardiovascular: RRR, S1/S2, no rubs, no gallops  Respiratory: CTA bilaterally, no wheezing, no crackles, no rhonchi  Abdomen: Soft/+BS, non tender, non distended, no guarding  Extremities: 1+LE edema, No lymphangitis, No petechiae, No rashes, no synovitis  Data Reviewed: Basic Metabolic Panel:  Recent Labs Lab 02/07/15 0324 02/07/15 0337 02/07/15 1020 02/08/15 0403 02/09/15 0410 02/10/15 0550 02/11/15 0532  NA 134* 134*  --  139 142 141 140  K 2.8* 2.9*  --  2.3* 3.1* 3.1* 2.9*  CL 99 98  --  105 110 109 106  CO2 24  --   --  24 23 24 24   GLUCOSE 90 89  --  102* 120* 103* 97  BUN 23 27*  --  12 8 <5* <5*  CREATININE 1.32* 1.20*  --  0.57 0.42* 0.37* 0.39*  CALCIUM 8.5  --   --  7.8* 7.9* 8.0* 8.0*  MG  --   --  1.8  --  1.8  --   --    Liver Function Tests:  Recent Labs Lab 02/07/15 0324 02/08/15 0403 02/09/15 0410 02/10/15 0550 02/11/15 0532  AST 996* 1089* 248* 68* 29  ALT 314* 623* 352* 199* 116*  ALKPHOS 139* 106 96 95 92  BILITOT 0.8 1.1 1.0 0.8 0.8  PROT 5.6* 4.6* 4.7* 4.8* 4.8*  ALBUMIN 2.9* 2.3* 2.2* 2.1* 2.3*   No results for input(s): LIPASE, AMYLASE in the last 168 hours. No results for input(s): AMMONIA in the last 168 hours. CBC:  Recent Labs Lab 02/07/15 0324 02/07/15 0337 02/08/15 0403 02/09/15 0410 02/10/15 0550  WBC 16.9*  --  11.4* 10.0 8.0  HGB 10.8* 12.6 9.8* 9.8* 9.6*  HCT 33.5* 37.0 29.8* 30.0* 29.6*  MCV 88.4  --  86.1 87.5 88.9  PLT 510*  --  414* 421* 411*   Cardiac Enzymes:  Recent Labs Lab 02/07/15 0324 02/08/15 0403  CKTOTAL 324* 122   BNP: Invalid input(s): POCBNP CBG: No results for input(s): GLUCAP in the last 168 hours.  Recent Results (from the past  240 hour(s))  MRSA PCR Screening     Status: None   Collection Time: 02/07/15 10:00 AM  Result Value Ref Range Status   MRSA by PCR NEGATIVE NEGATIVE Final    Comment:        The GeneXpert MRSA Assay (FDA approved for NASAL specimens only), is one component of a comprehensive MRSA colonization surveillance program. It is not intended to diagnose MRSA infection nor to guide or monitor treatment for MRSA infections.   Culture, blood (routine x 2)     Status: None (Preliminary result)   Collection Time: 02/07/15 10:15 AM  Result Value Ref Range Status   Specimen Description BLOOD RIGHT ARM  Final   Special Requests BOTTLES DRAWN AEROBIC AND ANAEROBIC 5CC  Final   Culture   Final  BLOOD CULTURE RECEIVED NO GROWTH TO DATE CULTURE WILL BE HELD FOR 5 DAYS BEFORE ISSUING A FINAL NEGATIVE REPORT Performed at [facility]    Report Status PENDING  Incomplete  Culture, blood (routine x 2)     Status: None (Preliminary result)   Collection Time: 02/07/15 10:25 AM  Result Value Ref Range Status   Specimen Description BLOOD RIGHT ARM  Final   Special Requests BOTTLES DRAWN AEROBIC AND ANAEROBIC 3CC  Final   Culture   Final           BLOOD CULTURE RECEIVED NO GROWTH TO DATE CULTURE WILL BE HELD FOR 5 DAYS BEFORE ISSUING A FINAL NEGATIVE REPORT Note: Culture results may be compromised due to an inadequate volume of blood received in culture bottles. Performed at [facility]    Report Status PENDING  Incomplete  Urine culture     Status: None   Collection Time: 02/07/15 11:17 AM  Result Value Ref Range Status   Specimen Description URINE, CLEAN CATCH  Final   Special Requests NONE  Final   Colony Count   Final    50,000 COLONIES/ML Performed at [facility]    Culture   Final    Multiple bacterial morphotypes present, none predominant. Suggest appropriate recollection if clinically indicated. Performed at [facility]    Report Status  02/08/2015 FINAL  Final  Clostridium Difficile by PCR     Status: None   Collection Time: 02/11/15  6:17 AM  Result Value Ref Range Status   C difficile by pcr NEGATIVE NEGATIVE Final     Scheduled Meds: . anastrozole  1 mg Oral Daily  .  ceFAZolin (ANCEF) IV  1 g Intravenous 3 times per day  . docusate sodium  100 mg Oral BID  . enoxaparin (LOVENOX) injection  40 mg Subcutaneous Q24H  . levothyroxine  150 mcg Oral QAC breakfast  . pantoprazole  40 mg Oral Daily  . potassium chloride  10 mEq Intravenous Q1 Hr x 3  . sodium chloride  3 mL Intravenous Q12H   Continuous Infusions: . sodium chloride 0.9 % 1,000 mL with potassium chloride 20 mEq infusion 75 mL/hr at 02/11/15 1258     Kaeleb Emond, DO  [facility] Pager [phone]  If 7PM-7AM, please contact night-coverage [email or web address] Password [email or web address] 02/11/2015, 4:50 PM   LOS: 4 days

## 2015-02-11 NOTE — Progress Notes (Signed)
PT Cancellation Note  Patient Details Name: Jacqueline Moyer MRN: [id number] DOB: 1956/08/01   Cancelled Treatment:    Reason Eval/Treat Not Completed: Patient declined,  (pt. reports having diarrhea and did not want to get up at this time. CNA reports ambulating to bathroom w/out problems. PT will return tomorrow.)   [name] 02/11/2015, 8:26 AM Tresa Endo PT [phone]

## 2015-02-12 ENCOUNTER — Inpatient Hospital Stay (HOSPITAL_COMMUNITY)
Admission: EM | Admit: 2015-02-12 | Discharge: 2015-02-23 | DRG: 885 | Disposition: A | Discharge status: Home / Self Care | Payer: Medicare Other | Source: Intra-hospital | Attending: Psychiatry | Admitting: Psychiatry

## 2015-02-12 ENCOUNTER — Encounter (HOSPITAL_COMMUNITY): Payer: Self-pay | Admitting: *Deleted

## 2015-02-12 DIAGNOSIS — C50919 Malignant neoplasm of unspecified site of unspecified female breast: Secondary | ICD-10-CM | POA: Diagnosis present

## 2015-02-12 DIAGNOSIS — T43022A Poisoning by tetracyclic antidepressants, intentional self-harm, initial encounter: Secondary | ICD-10-CM | POA: Diagnosis not present

## 2015-02-12 DIAGNOSIS — E039 Hypothyroidism, unspecified: Secondary | ICD-10-CM | POA: Diagnosis present

## 2015-02-12 DIAGNOSIS — Z85038 Personal history of other malignant neoplasm of large intestine: Secondary | ICD-10-CM

## 2015-02-12 DIAGNOSIS — I10 Essential (primary) hypertension: Secondary | ICD-10-CM | POA: Diagnosis present

## 2015-02-12 DIAGNOSIS — Z9884 Bariatric surgery status: Secondary | ICD-10-CM | POA: Diagnosis not present

## 2015-02-12 DIAGNOSIS — Z79899 Other long term (current) drug therapy: Secondary | ICD-10-CM | POA: Diagnosis not present

## 2015-02-12 DIAGNOSIS — J449 Chronic obstructive pulmonary disease, unspecified: Secondary | ICD-10-CM | POA: Diagnosis present

## 2015-02-12 DIAGNOSIS — M7989 Other specified soft tissue disorders: Secondary | ICD-10-CM | POA: Insufficient documentation

## 2015-02-12 DIAGNOSIS — F332 Major depressive disorder, recurrent severe without psychotic features: Secondary | ICD-10-CM | POA: Diagnosis present

## 2015-02-12 DIAGNOSIS — T391X2A Poisoning by 4-Aminophenol derivatives, intentional self-harm, initial encounter: Secondary | ICD-10-CM | POA: Diagnosis not present

## 2015-02-12 DIAGNOSIS — T402X2A Poisoning by other opioids, intentional self-harm, initial encounter: Secondary | ICD-10-CM | POA: Diagnosis not present

## 2015-02-12 DIAGNOSIS — F329 Major depressive disorder, single episode, unspecified: Secondary | ICD-10-CM | POA: Diagnosis present

## 2015-02-12 HISTORY — DX: Anxiety disorder, unspecified: F41.9

## 2015-02-12 LAB — COMPREHENSIVE METABOLIC PANEL
ALK PHOS: 86 U/L (ref 39–117)
ALT: 69 U/L — AB (ref 0–35)
AST: 29 U/L (ref 0–37)
Albumin: 2.2 g/dL — ABNORMAL LOW (ref 3.5–5.2)
Anion gap: 8 (ref 5–15)
BUN: <5 mg/dL — ABNORMAL LOW (ref 6–23)
CALCIUM: 7.9 mg/dL — AB (ref 8.4–10.5)
CO2: 21 mmol/L (ref 19–32)
Chloride: 108 mmol/L (ref 96–112)
Creatinine, Ser: 0.5 mg/dL (ref 0.50–1.10)
GFR calc non Af Amer: >90 mL/min (ref >90)
GLUCOSE: 90 mg/dL (ref 70–99)
POTASSIUM: 3.8 mmol/L (ref 3.5–5.1)
SODIUM: 137 mmol/L (ref 135–145)
Total Bilirubin: 0.8 mg/dL (ref 0.3–1.2)
Total Protein: 4.7 g/dL — ABNORMAL LOW (ref 6.0–8.3)

## 2015-02-12 LAB — MAGNESIUM: MAGNESIUM: 1.4 mg/dL — AB (ref 1.5–2.5)

## 2015-02-12 MED ORDER — MAGNESIUM SULFATE 2 GM/50ML IV SOLN
2.0000 g | Freq: Once | INTRAVENOUS | Status: AC
  Administered 2015-02-12: 2 g via INTRAVENOUS
  Filled 2015-02-12: qty 50

## 2015-02-12 MED ORDER — TRAZODONE HCL 50 MG PO TABS
50.0000 mg | ORAL_TABLET | Freq: Every evening | ORAL | Status: DC | PRN
  Administered 2015-02-12 – 2015-02-13 (×2): 50 mg via ORAL
  Filled 2015-02-12 (×2): qty 1

## 2015-02-12 MED ORDER — CEPHALEXIN 500 MG PO CAPS
500.0000 mg | ORAL_CAPSULE | Freq: Four times a day (QID) | ORAL | Status: DC
  Filled 2015-02-12 (×3): qty 1

## 2015-02-12 MED ORDER — NICOTINE 21 MG/24HR TD PT24
21.0000 mg | MEDICATED_PATCH | Freq: Every day | TRANSDERMAL | Status: DC
  Administered 2015-02-13 – 2015-02-23 (×10): 21 mg via TRANSDERMAL
  Filled 2015-02-12 (×13): qty 1

## 2015-02-12 MED ORDER — OXYCODONE HCL 5 MG PO TABS
5.0000 mg | ORAL_TABLET | Freq: Four times a day (QID) | ORAL | Status: DC | PRN
  Administered 2015-02-12 – 2015-02-13 (×4): 5 mg via ORAL
  Filled 2015-02-12 (×4): qty 1

## 2015-02-12 MED ORDER — CEPHALEXIN 500 MG PO CAPS: 500.0000 mg | ORAL_CAPSULE | Freq: Four times a day (QID) | ORAL | Status: DC

## 2015-02-12 MED ORDER — OMEPRAZOLE 40 MG PO CPDR
40.0000 mg | DELAYED_RELEASE_CAPSULE | Freq: Every day | ORAL | Status: AC
Start: 2015-02-12 — End: ?
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
## 2015-02-12 NOTE — Tx Team (Signed)
Initial Interdisciplinary Treatment Plan   PATIENT STRESSORS: Financial difficulties Health problems Marital or family conflict Occupational concerns Traumatic event   PATIENT STRENGTHS: Ability for insight Average or above average intelligence Capable of independent living Communication skills General fund of knowledge Motivation for treatment/growth Physical Health Supportive family/friends   PROBLEM LIST: Problem List/Patient Goals Date to be addressed Date deferred Reason deferred Estimated date of resolution  "depression" 02/12/2015   D/c        "anxiety" 02/12/2015   D/c        "Suicidal thoughts" 02/12/2015   D/c                           DISCHARGE CRITERIA:  Ability to meet basic life and health needs Adequate post-discharge living arrangements Improved stabilization in mood, thinking, and/or behavior Medical problems require only outpatient monitoring Motivation to continue treatment in a less acute level of care Need for constant or close observation no longer present Reduction of life-threatening or endangering symptoms to within safe limits Safe-care adequate arrangements made Verbal commitment to aftercare and medication compliance  PRELIMINARY DISCHARGE PLAN: Attend aftercare/continuing care group Attend PHP/IOP Outpatient therapy Placement in alternative living arrangements  PATIENT/FAMIILY INVOLVEMENT: This treatment plan has been presented to and reviewed with the patient, Jacqueline Moyer.  The patient and family have been given the opportunity to ask questions and make suggestions.  Cammy Copa 02/12/2015, 7:37 PM
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
## 2015-02-12 NOTE — Clinical Social Work Note (Signed)
Patient for d/c today to [facility] bed at [place]. Patient agreeable- plan transfer via Theophilus Kinds, MSW, LCSWA [phone]

## 2015-02-12 NOTE — Progress Notes (Signed)
PT Cancellation Note  Patient Details Name: Jacqueline Moyer MRN: [id number] DOB: 1956-08-19   Cancelled Treatment:    Reason Eval/Treat Not Completed: Patient declined, patient up in recliner, states she doesn't want to walk with a gown on. Offered another gown but patient declined. Will sign off if patient declines again. CNA states that patient is ambulatory in the room. May not  Require PT eval.  [name] 02/12/2015, 10:07 AM Tresa Endo PT [phone]

## 2015-02-12 NOTE — Progress Notes (Signed)
Pt referred to [place], [facility], [place], [place].   No beds available  [place], [place] Work  [employer] [phone]

## 2015-02-12 NOTE — Progress Notes (Signed)
Report called to Nurse Rise Paganini at [facility]. Pt transported in stable condition to [facility] via [place].

## 2015-02-12 NOTE — Consult Note (Signed)
Psychiatry Consult follow-up  Reason for Consult:  Intentional overdose with suicide intent Referring Physician:  Dr. Ernestina Patches  Patient Identification: Jacqueline Moyer MRN:  [id number] Principal Diagnosis: MDD (major depressive disorder), recurrent severe, without psychosis Diagnosis:   Patient Active Problem List   Diagnosis Date Noted  . Drug-induced hepatitis [K75.9] 02/08/2015  . Suicide attempt by acetaminophen overdose [T39.1X2A]   . Overdose [T50.901A] 02/07/2015  . Intentional acetaminophen overdose [T39.1X2A] 02/07/2015  . Cellulitis and abscess of leg [L02.419, L03.119] 02/07/2015  . AKI (acute kidney injury) [N17.9] 02/07/2015  . Transaminasemia [R74.0] 02/07/2015  . MDD (major depressive disorder), recurrent severe, without psychosis [F33.2] 02/07/2015  . Paresthesias [R20.2] 01/28/2015  . 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 Dysphagia [R13.10] 01/28/2015  . Breast cancer of lower-outer quadrant of left female breast [C50.512] 12/22/2014  . Proximal muscle weakness [M62.89] 12/09/2014  . Protein-calorie malnutrition, severe [E43] 11/21/2014  . Hypokalemia [E87.6] 11/19/2014  . Benign essential HTN [I10] 11/19/2014  . UTI (lower urinary tract infection) [N39.0] 11/19/2014  . Abdominal pain [R10.9] 11/19/2014  . GERD [K21.9] 08/29/2008  . History of malignant neoplasm of large intestine [Z85.038] 08/29/2008    Total Time spent with patient: 20 minutes  Subjective:   Connor Foxworthy is a 59 y.o. female patient admitted with intentional overdose with suicide intent.  HPI:  Gae Bihl is a 59 year old female seen and chart reviewed and case discussed with Staff RN. Patient has taken intentional overdose of several prescription medication with suicide intent and wrote a suicide note. Patient reported feeling depressed and having chronic pain in her back not controlled with her current medication. She reports her medication remeron was  given by pain clinic. She has history of depression and previous psych admission for depression and suicide attempt. She was previously involved with group therapies and individual therapy at [facility] but does not like the individual therapy. Patient stated that her BF [name] has been tired off dealing with her emotions and left her. She states that she does not blame him. She has elevated liver function tests and serum creatinine on admission.   Interval history: Patient seen today for psychiatric consultation follow-up with the psychiatric social service. Patient reported feeling depressed, lack of interest, poor motivation, isolated, somewhat withdrawn, hopeless, helpless, worthless and not able to contract for safety. Patient stated she went to the home where she grew up with her dad and Overdosed of her medication to end her life. Patient reported she has no known psychosocial support besides her boyfriend of 8 years who seems to be not supportive to her at this time. Patient seems like burned bridges with him. Patient continue to report depression and suicidal ideation secondary to broken up with her boyfriend. Patient continued to meet criteria for acute psychiatric hospitalization when medically stable.    Past Medical History:  Past Medical History  Diagnosis Date  . Hypertension   . Thyroid disease     hyperthyroidism  . Chronic back pain   . Chronic neck pain   . Depression   . COPD (chronic obstructive pulmonary disease)   . Anemia   . Cancer approx 2006    colon cancer  . Blood transfusion without reported diagnosis   . GERD (gastroesophageal reflux disease)   . Arthritis     Past Surgical History  Procedure Laterality Date  . Cholecystectomy    . Tonsillectomy    . Abdominal hysterectomy    . Hernia repair    . Colon surgery  approx. 2006    for colon cancer  . Gastric bypass  2000   Family History:  Family History  Problem Relation Age of Onset  . Colon cancer Father 36   . Prostate cancer Brother    Social History:  History  Alcohol Use No     History  Drug Use No    History   Social History  . Marital Status: Divorced    Spouse Name: Leanna Sato   . Number of Children: 1  . Years of Education: 12+   Occupational History  . Disabled     Social History Main Topics  . Smoking status: Former Smoker -- 1.00 packs/day for 40 years    Types: Cigarettes    Quit date: 12/27/2013  . Smokeless tobacco: Current User    Last Attempt to Quit: 04/04/2013  . Alcohol Use: No  . Drug Use: No  . Sexual Activity: No   Other Topics Concern  . None   Social History Narrative   Patient lives at home with life partner Leanna Sato    Patient has 1 child    Patient is disabled    Patient has a BS degree       Allergies:  No Known Allergies  Vitals: Blood pressure 119/68, pulse 71, temperature 97.9 F (36.6 C), temperature source Oral, resp. rate 18, height 5\' 4"  (1.626 m), weight 77.7 kg (171 lb 4.8 oz), SpO2 97 %.  Risk to Self: Is patient at risk for suicide?: Yes Risk to Others:   Prior Inpatient Therapy:   Prior Outpatient Therapy:    Current Facility-Administered Medications  Medication Dose Route Frequency Provider Last Rate Last Dose  . anastrozole (ARIMIDEX) tablet 1 mg  1 mg Oral Daily Orson Eva, MD   1 mg at 02/12/15 0948  . cephALEXin (KEFLEX) capsule 500 mg  500 mg Oral 4 times per day Orson Eva, MD      . docusate sodium (COLACE) capsule 100 mg  100 mg Oral BID Orson Eva, MD   100 mg at 02/12/15 0948  . enoxaparin (LOVENOX) injection 40 mg  40 mg Subcutaneous Q24H Orson Eva, MD   40 mg at 02/12/15 1203  . levothyroxine (SYNTHROID, LEVOTHROID) tablet 150 mcg  150 mcg Oral QAC breakfast Orson Eva, MD   150 mcg at 02/12/15 3335  . magnesium sulfate IVPB 2 g 50 mL  2 g Intravenous Once Orson Eva, MD   2 g at 02/12/15 1512  . ondansetron (ZOFRAN) tablet 4 mg  4 mg Oral Q6H PRN Orson Eva, MD       Or  . ondansetron [facility]) injection  4 mg  4 mg Intravenous Q6H PRN Orson Eva, MD      . oxyCODONE (Oxy IR/ROXICODONE) immediate release tablet 5 mg  5 mg Oral Q4H PRN Orson Eva, MD   5 mg at 02/12/15 1411  . pantoprazole (PROTONIX) EC tablet 40 mg  40 mg Oral Daily Orson Eva, MD   40 mg at 02/12/15 0947  . sodium chloride 0.9 % 1,000 mL with potassium chloride 20 mEq infusion   Intravenous Continuous Orson Eva, MD 75 mL/hr at 02/12/15 0813    . sodium chloride 0.9 % injection 3 mL  3 mL Intravenous Q12H Orson Eva, MD   3 mL at 02/10/15 2110    Musculoskeletal: Strength & Muscle Tone: decreased Gait & Station: unable to stand Patient leans: N/A  Psychiatric Specialty Exam: Physical Exam as per history and physical  ROS confusion, depression and status post suicide attempt.  Blood pressure 119/68, pulse 71, temperature 97.9 F (36.6 C), temperature source Oral, resp. rate 18, height 5\' 4"  (8.527 m), weight 77.7 kg (171 lb 4.8 oz), SpO2 97 %.Body mass index is 29.39 kg/(m^2).  General Appearance: Guarded  Eye Contact::  Fair  Speech:  Slow  Volume:  Decreased  Mood:  Anxious and Depressed  Affect:  Depressed and Flat  Thought Process:  Disorganized, Irrelevant and Loose  Orientation:  Full (Time, Place, and Person)  Thought Content:  WDL  Suicidal Thoughts:  Yes.  with intent/plan  Homicidal Thoughts:  No  Memory:  Immediate;   Fair Recent;   Poor  Judgement:  Impaired  Insight:  Lacking  Psychomotor Activity:  Restlessness  Concentration:  Fair  Recall:  [place] of Knowledge:Good  Language: Good  Akathisia:  Negative  Handed:  Right  AIMS (if indicated):     Assets:  Agricultural consultant Housing Leisure Time Resilience Social Support Transportation  ADL's:  Impaired  Cognition: Impaired,  Mild  Sleep:      Medical Decision Making: New problem, with additional work up planned, Review of Psycho-Social Stressors (1), Review or order clinical lab tests (1), Review and  summation of old records (2), Established Problem, Worsening (2), Review or order medicine tests (1), Review of Medication Regimen & Side Effects (2) and Review of New Medication or Change in Dosage (2)  Treatment Plan Summary: Daily contact with patient to assess and evaluate symptoms and progress in treatment and Medication management  Plan: Continue safety sitter and patient needs criteria for inpatient psychiatric hospitalization Recommend psychiatric Inpatient admission when medically cleared. Supportive therapy provided about ongoing stressors.  Appreciate psychiatric consultation and follow up as clinically required Please contact [phone] or [phone] if needs further assistance  Disposition: Patient meets criteria for acute psych admission when medically stable.  Guila Owensby,JANARDHAHA R. 02/12/2015 3:53 PM

## 2015-02-13 LAB — CULTURE, BLOOD (ROUTINE X 2)
CULTURE: NO GROWTH
Culture: NO GROWTH

## 2015-02-13 MED ORDER — DULOXETINE HCL 20 MG PO CPEP
20.0000 mg | ORAL_CAPSULE | Freq: Every day | ORAL | Status: DC
  Administered 2015-02-14: 20 mg via ORAL
  Filled 2015-02-13 (×2): qty 1

## 2015-02-13 MED ORDER — OXYCODONE HCL 5 MG PO TABS
10.0000 mg | ORAL_TABLET | ORAL | Status: DC | PRN
  Administered 2015-02-13 – 2015-02-14 (×3): 10 mg via ORAL
  Filled 2015-02-13 (×3): qty 2

## 2015-02-13 MED ORDER — ADULT MULTIVITAMIN W/MINERALS CH
1.0000 | ORAL_TABLET | Freq: Every day | ORAL | Status: DC
  Administered 2015-02-13 – 2015-02-20 (×8): 1 via ORAL
  Filled 2015-02-13 (×13): qty 1

## 2015-02-13 NOTE — Progress Notes (Signed)
Pt attended spiritual care group on grief and loss facilitated by chaplain Jerene Pitch and counseling intern [place] Nathanael Krist. Group opened with brief discussion and psycho-social ed around grief and loss in relationships and in relation to self - identifying life patterns, circumstances, changes that cause losses. Established group norm of speaking from own life experience. Group goal of establishing open and affirming space for members to share loss and experience with grief, normalize grief experience and provide psycho social education and grief support.  Group drew on narrative and Alderian therapeutic modalities.   Jacqueline Moyer was present throughout group but did not contribute to group discussion. Affect appeared depressed and was tearful during group.   [place] Krikor Willet Counseling Intern

## 2015-02-13 NOTE — [facility] Group Notes (Signed)
[facility] LCSW Group Therapy      Feelings About Diagnosis 1:15 - 2:30 PM         02/13/2015    Type of Therapy:  Group Therapy  Participation Level:  Active  Participation Quality:  Appropriate  Affect:  Appropriate  Cognitive: Appropriate  Insight:  Developing/Improving   Engagement in Therapy:  Developing/Improving   Modes of Intervention:  Discussion, Education, Exploration, Problem-Solving, Rapport Building, Support  Summary of Progress/Problems:  Patient actively participated in group. Patient discussed shared she is embarrassed to have a mental health problem.  She stated she used to be on the other side of the table helping others but now needs help.  CSW informed group any one of [place] could be in need of mental health services and we should not be ashamed to ask for help.  Jacqueline Moyer 02/13/2015
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
## 2015-02-13 NOTE — Progress Notes (Addendum)
NUTRITION ASSESSMENT  RD consulted for pt with poor PO intake and pt was identified as at risk on the Malnutrition Screen Tool  INTERVENTION: 1. Educated patient on the importance of nutrition and encouraged intake of food and beverages. 2. Supplements: pt declined 3. Would like snacks provided between meals. 4. Multivitamin with minerals daily  NUTRITION DIAGNOSIS: Inadequate oral intake related to history of disordered eating as evidenced by pt report.  Goal: Pt to meet >/= 90% of their estimated nutrition needs.  Monitor:  PO intake  Assessment:  Pt admitted with depression and anxiety.  PMHx of breast cancer, gastric bypass surgery (15 yrs ago), and disordered eating.  Per weight history pt has lost 54 lb since 06/14/14 (23% weight loss x 8 months, significant for time frame). Pt's weight has increased over the past month.   Pt reports not eating d/t decreased appetite. Pt has been followed by RD at [facility] for her disordered eating patterns. When asked if she will follow-up with RD after discharge, pt unsure because she states she is moving to [place] to live with her daughter.   Pt states she is aware that she needs to consume enough protein. Pt ate breakfast this AM (bacon, eggs, and biscuit). Pt does not want Ensure supplements. Prefers to consume high protein snacks during her hospitalization.   PTA pt was taking a daily multi-vitamin. RD to order MVI for pt.  Pt with noticeable wasting in clavicle, arm and temporal regions.  Pt meets criteria for severe MALNUTRITION in the context of chronic illness as evidenced by 23% weight loss x 8 months and severe muscle depletion.  Height: Ht Readings from Last 1 Encounters:  02/12/15 5' 5.75" (1.67 m)    Weight: Wt Readings from Last 1 Encounters:  02/12/15 182 lb (82.555 kg)    Weight Hx: Wt Readings from Last 10 Encounters:  02/12/15 182 lb (82.555 kg)  01/23/15 165 lb 8 oz (75.07 kg)  01/02/15 183 lb 8 oz (83.235 kg)   12/27/14 182 lb 1.6 oz (82.6 kg)  12/08/14 188 lb 12.8 oz (85.639 kg)  11/19/14 170 lb (77.111 kg)  08/08/14 227 lb (102.967 kg)  07/25/14 227 lb 9.6 oz (103.239 kg)  06/28/14 236 lb (107.049 kg)  06/14/14 236 lb 12.8 oz (107.412 kg)    BMI:  Body mass index is 29.6 kg/(m^2). Pt meets criteria for overweight based on current BMI.  Estimated Nutritional Needs: Kcal: 25-30 kcal/kg Protein: > 1 gram protein/kg Fluid: 1 ml/kcal  Diet Order: Diet regular Pt is also offered choice of unit snacks mid-morning and mid-afternoon.   Lab results and medications reviewed.   Clayton Bibles, MS, RD, LDN Pager: [phone] After Hours Pager: [phone]

## 2015-02-13 NOTE — Progress Notes (Signed)
D-pt sts she slept poorly and has a fair appetite, pt c/o a little depression and anxiety, but mostly chronic pain in her back and right hip; sts she can not get her pain under control A-pt took her meds this am and attended group R-cont to monitor for safety
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
## 2015-02-13 NOTE — Progress Notes (Signed)
Patient ID: Jacqueline Moyer, female   DOB: [birth year], 59 y.o.   MRN: [id number] PER STATE REGULATIONS 482.30  THIS CHART WAS REVIEWED FOR MEDICAL NECESSITY WITH RESPECT TO THE PATIENT'S ADMISSION/DURATION OF STAY.  NEXT REVIEW DATE: 02/16/15  Roma Schanz, RN, BSN CASE MANAGER

## 2015-02-13 NOTE — Progress Notes (Signed)
Patient at the medication window this evening . She asked this Probation officer if the physician made any adjustment to her medications. Writer told patient that Cymbalta was added. Patient stated; "what about my Percocet and other medications"? Writer told patient she had oxy IR and can take it if she needed it. Patient started crying and said the doctor promised to order all her medications. Writer encouraged patient to talk to physician about any medication adjustment she needs in the morning. She received the Oxy IR and went to her hall. Q 15 minute check continued to maintain safety.
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
## 2015-02-14 DIAGNOSIS — M7989 Other specified soft tissue disorders: Secondary | ICD-10-CM

## 2015-02-14 DIAGNOSIS — F332 Major depressive disorder, recurrent severe without psychotic features: Principal | ICD-10-CM

## 2015-02-14 LAB — BASIC METABOLIC PANEL WITH GFR
Anion gap: 10 (ref 5–15)
BUN: 8 mg/dL (ref 6–23)
CO2: 20 mmol/L (ref 19–32)
Calcium: 8.2 mg/dL — ABNORMAL LOW (ref 8.4–10.5)
Chloride: 109 mmol/L (ref 96–112)
Creatinine, Ser: 0.54 mg/dL (ref 0.50–1.10)
GFR calc Af Amer: >90 mL/min
GFR calc non Af Amer: >90 mL/min
Glucose, Bld: 93 mg/dL (ref 70–99)
Potassium: 3.4 mmol/L — ABNORMAL LOW (ref 3.5–5.1)
Sodium: 139 mmol/L (ref 135–145)

## 2015-02-14 MED ORDER — PROMETHAZINE HCL 25 MG PO TABS
25.0000 mg | ORAL_TABLET | Freq: Four times a day (QID) | ORAL | Status: DC | PRN
  Administered 2015-02-16 – 2015-02-21 (×7): 25 mg via ORAL
  Filled 2015-02-14 (×8): qty 1

## 2015-02-14 MED ORDER — DULOXETINE HCL 30 MG PO CPEP
30.0000 mg | ORAL_CAPSULE | Freq: Every day | ORAL | Status: DC
  Administered 2015-02-15: 30 mg via ORAL
  Filled 2015-02-14 (×2): qty 1

## 2015-02-14 MED ORDER — OXYCODONE HCL 5 MG PO TABS
20.0000 mg | ORAL_TABLET | Freq: Four times a day (QID) | ORAL | Status: DC | PRN
  Administered 2015-02-14 – 2015-02-23 (×33): 20 mg via ORAL
  Filled 2015-02-14 (×33): qty 4

## 2015-02-14 MED ORDER — PROMETHAZINE HCL 25 MG PO TABS
25.0000 mg | ORAL_TABLET | Freq: Once | ORAL | Status: AC
  Administered 2015-02-14: 25 mg via ORAL
  Filled 2015-02-14: qty 1

## 2015-02-14 MED ORDER — PROMETHAZINE HCL 25 MG PO TABS
ORAL_TABLET | ORAL | Status: AC
  Administered 2015-02-14: 25 mg via ORAL
  Filled 2015-02-14: qty 1

## 2015-02-14 MED ORDER — ANASTROZOLE 1 MG PO TABS
1.0000 mg | ORAL_TABLET | Freq: Every day | ORAL | Status: DC
  Administered 2015-02-14 – 2015-02-23 (×10): 1 mg via ORAL
  Filled 2015-02-14 (×12): qty 1

## 2015-02-14 MED ORDER — LEVOTHYROXINE SODIUM 75 MCG PO TABS
75.0000 ug | ORAL_TABLET | Freq: Every day | ORAL | Status: DC
  Administered 2015-02-15 – 2015-02-23 (×9): 75 ug via ORAL
  Filled 2015-02-14 (×11): qty 1

## 2015-02-14 MED ORDER — FUROSEMIDE 20 MG PO TABS
20.0000 mg | ORAL_TABLET | Freq: Every day | ORAL | Status: DC
  Administered 2015-02-14 – 2015-02-15 (×2): 20 mg via ORAL
  Filled 2015-02-14 (×4): qty 1

## 2015-02-14 MED ORDER — PANTOPRAZOLE SODIUM 40 MG PO TBEC
40.0000 mg | DELAYED_RELEASE_TABLET | Freq: Every day | ORAL | Status: DC
  Administered 2015-02-14 – 2015-02-23 (×10): 40 mg via ORAL
  Filled 2015-02-14 (×13): qty 1

## 2015-02-14 MED ORDER — TRAZODONE HCL 50 MG PO TABS
25.0000 mg | ORAL_TABLET | Freq: Every evening | ORAL | Status: DC | PRN
  Administered 2015-02-14 – 2015-02-19 (×5): 25 mg via ORAL
  Filled 2015-02-14 (×4): qty 1
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
## 2015-02-14 NOTE — Progress Notes (Signed)
D:Patient in her room on approach.  Patient states she had a better day but states she continues to be depressed.  Patient states she is worried because she, Education officer, museum, and doctor were unable to contact her daughter today.  Patient states her goal for today was to hear from her daughter (clarified and she wanted her daughter to call her) but Probation officer explained to patient she needed to set a goal for herself.  Patient states she has not learned anything today.  Patient denies SI/HI and denies AVH.   A: Staff to monitor Q 15 mins for safety.  Encouragement and support offered.  Scheduled medications administered per orders.  Oxycodone administered prn for back pain and trazodone administered prn for sleep.   R: Patient remains safe on the unit.  Patient attended group tonight.  Patient visible on the unit and interacting with peers .  Patient taking administered medications

## 2015-02-14 NOTE — Progress Notes (Signed)
Recreation Therapy Notes  Date: 03.23.2016 Time: 9:30am Location: [address] Dayroom   Group Topic: Stress Management  Goal Area(s) Addresses:  Patient will actively participate in stress management techniques presented during session.   Behavioral Response: Did not attend.   Laureen Ochs Nels Munn, LRT/CTRS        Loralai Eisman L 02/14/2015 4:08 PM

## 2015-02-14 NOTE — Progress Notes (Signed)
Recreation Therapy Notes  Animal-Assisted Activity/Therapy (AAA/T) Program Checklist/Progress Notes Patient Eligibility Criteria Checklist & Daily Group note for Rec Tx Intervention  Date: 03.22.2016 Time: 2:45pm Location: [address]    AAA/T Program Assumption of Risk Form signed by Patient/ or Parent Legal Guardian yes  Patient is free of allergies or sever asthma yes  Patient reports no fear of animals yes  Patient reports no history of cruelty to animals yes  Patient understands his/her participation is voluntary yes  Patient washes hands before animal contact yes  Patient washes hands after animal contact yes  Behavioral Response: Appropriate, Tearful  Education: Hand Washing, Appropriate Animal Interaction   Education Outcome: Acknowledges education.   Clinical Observations/Feedback: Patient attended session, but opted to not interact with therapy dog stating the session was making her miss her golden retriever at home.    Laureen Ochs Lenah Messenger, LRT/CTRS  Annisten Manchester L 02/14/2015 8:24 AM

## 2015-02-14 NOTE — BHH Suicide Risk Assessment (Addendum)
[facility] INPATIENT:  Family/Significant Other Suicide Prevention Education  Suicide Prevention Education:  Contact Attempts: Ahmed Prima, Daughter [phone] or [phone];  has been identified by the patient as the family member/significant other with whom the patient will be residing, and identified as the person(s) who will aid the patient in the event of a mental health crisis.  With written consent from the patient, two attempts were made to provide suicide prevention education, prior to and/or following the patient's discharge.  We were unsuccessful in providing suicide prevention education.  A suicide education pamphlet was given to the patient to share with family/significant other.  Date and time of first attempt: Message left on home voicemail on 02/14/15 at 8:45 AM.  Unable to leave message on cell phone. Date and time of second attempt:  Message left on home voicemail on 02/15/15 at 8:40 AM.  Unable to message on cell phone  Concha Pyo 02/14/2015, 8:46 AM

## 2015-02-14 NOTE — Progress Notes (Signed)
Spoke with Dr. Parke Poisson about patient's medical issues. Patient has history of hypothyroidism, breast cancer. She is on Synthroid, Arimidex. She has lower extremity swelling, chronic and may be low worse recently. She used to be on Lasix 40 mg daily. Review in Epic indicates that last 2-D echo was in December 2015 with preserved ejection fraction. BNP in past within reasonable limits. TSH low at 0.16 recently.  Recommendations to start Lasix 20 mg daily for now. Renal function is within normal limits. Obtain BNP. Will follow-up on this blood work results. Would not reorder 2-D echo unless BNP lot worse than recent baseline. In regards to low TSH, It has actually improved. She is on Synthroid 150 g daily. We will actually start Synthroid 75 g daily because of low TSH and she needs outpatient follow-up TSH.  We'll follow-up the BNP level. Otherwise no need to follow. Patient needs strict outpatient follow-up with PCP.  Thank you Leisa Lenz [facility] [phone]

## 2015-02-14 NOTE — BHH Group Notes (Signed)
[facility] LCSW Group Therapy [facility] LCSW Aftercare Discharge Planning Group Note   02/14/2015  10:40 AM    Participation Quality:  Appropraite  Mood/Affect:  Appropriate  Depression Rating:  8  Anxiety Rating:  7  Thoughts of Suicide:  No  Will you contract for safety?   NA  Current AVH:  No  Plan for Discharge/Comments:  Patient attended discharge planning group and actively participated in group. Patient advised she eventually plans to return to [place] to be with daughter.  She is uncertain where she will live at discharge but is not interested in an assisted living facility.  She will be referred to [facility] at discharge.  Suicide prevention education reviewed and SPE document provided.   Transportation Means: Patient has transportation.   Supports:  Patient has a limited support system.   Concha Pyo 02/14/2015   10:40 AM

## 2015-02-14 NOTE — Progress Notes (Signed)
[facility] MD Progress Note  02/14/2015 10:59 AM Jacqueline Moyer  MRN:  [id number]  Subjective:  She states that she is more upset today because she has not been able to get her pain under control.   Objective: Patient seen and care discussed with treatment team.  Patient has remained depressed and somehwa tirriable, she is focused on her pain and feels that it is not being adequately reated at present. She states pain is 9/10. She does present with painful ambulation and has difficulty getting up from chair due to pain. She attributes ongoing depression at least partially to her pain and subsequent functonal limitations.  Have consulted hospitalist service due to worsening peripheral edema. Of note, patient has no hx of CHF, does not endorse orthopnea or NPD. She is not SOB at RA. As discussed with hospitalist, edema is mostly on lower extremities and considered to be dependent edema. She will be started on diuretic.  Patient denies current suicidal plan or intention and contracts for safety on the unit. Limited interaction with peers. No disruptive behaviors.  At this time, denies medication side effects. We reviewed her home medications and will restart Synthroid, opiate analgesic and hormonal management of breast cancer as per her home meds. Of note, she provided consent to contact her pharmacy and we confirmed that she has been prescribed percocet 20/500 mg Q6 hrs pain and Morphine IR 15 mg QHS. Patient prefers percocet or oxycodone to minimize use of acetaminophen.   Principal Problem: MDD (major depressive disorder), recurrent severe, without psychosis Diagnosis:   Patient Active Problem List   Diagnosis Date Noted  . Drug-induced hepatitis [K75.9] 02/08/2015  . Suicide attempt by acetaminophen overdose [T39.1X2A]   . Overdose [T50.901A] 02/07/2015  . Intentional acetaminophen overdose [T39.1X2A] 02/07/2015  . Cellulitis and abscess of leg [L02.419, L03.119] 02/07/2015  . AKI (acute kidney  injury) [N17.9] 02/07/2015  . Transaminasemia [R74.0] 02/07/2015  . MDD (major depressive disorder), recurrent severe, without psychosis [F33.2] 02/07/2015  . Paresthesias [R20.2] 01/28/2015  . 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 Dysphagia [R13.10] 01/28/2015  . Breast cancer of lower-outer quadrant of left female breast [C50.512] 12/22/2014  . Proximal muscle weakness [M62.89] 12/09/2014  . Protein-calorie malnutrition, severe [E43] 11/21/2014  . Hypokalemia [E87.6] 11/19/2014  . Benign essential HTN [I10] 11/19/2014  . UTI (lower urinary tract infection) [N39.0] 11/19/2014  . Abdominal pain [R10.9] 11/19/2014  . GERD [K21.9] 08/29/2008  . History of malignant neoplasm of large intestine [Z85.038] 08/29/2008   Total Time spent with patient: 20 minutes   Past Medical History:  Past Medical History  Diagnosis Date  . Hypertension   . Thyroid disease     hyperthyroidism  . Chronic back pain   . Chronic neck pain   . Depression   . COPD (chronic obstructive pulmonary disease)   . Anemia   . Cancer approx 2006    colon cancer  . Blood transfusion without reported diagnosis   . GERD (gastroesophageal reflux disease)   . Arthritis   . Anxiety     Past Surgical History  Procedure Laterality Date  . Cholecystectomy    . Tonsillectomy    . Abdominal hysterectomy    . Hernia repair    . Colon surgery  approx. 2006    for colon cancer  . Gastric bypass  2000   Family History:  Family History  Problem Relation Age of Onset  . Colon cancer Father 67  . Prostate cancer Brother  Social History:  History  Alcohol Use No    Comment: denied      History  Drug Use No    Comment: denied     History   Social History  . Marital Status: Divorced    Spouse Name: Leanna Sato   . Number of Children: 1  . Years of Education: 12+   Occupational History  . Disabled     Social History Main Topics  . Smoking status: Former Smoker --  1.00 packs/day for 40 years    Types: Cigarettes    Quit date: 12/27/2013  . Smokeless tobacco: Current User    Last Attempt to Quit: 04/04/2013  . Alcohol Use: No     Comment: denied   . Drug Use: No     Comment: denied   . Sexual Activity: No   Other Topics Concern  . None   Social History Narrative   Patient lives at home with life partner Leanna Sato    Patient has 1 child    Patient is disabled    Patient has a BS degree      Additional History:    Sleep: Fair  Appetite:  Improving   Assessment:   Musculoskeletal: Strength & Muscle Tone: within normal limits Gait & Station: normal Patient leans: N/A   Psychiatric Specialty Exam: Physical Exam  ROS - no SOB, CP, cough. Hip and leg pain, slow gait. Depression, no seizures. No rash. No fever, chills.   Blood pressure 132/80, pulse 80, temperature 97.8 F (36.6 C), temperature source Oral, resp. rate 18, height 5' 5.75" (1.67 m), weight 182 lb (82.555 kg).Body mass index is 29.6 kg/(m^2).  General Appearance: Fairly Groomed  Engineer, water::  Good  Speech:  Normal Rate  Volume:  Normal  Mood:  Depressed  Affect:  Constricted and irritable  Thought Process:  Goal Directed and Linear  Orientation:  Full (Time, Place, and Person)  Thought Content:  No delusions, no hallucinations, not internally preoccupied  Suicidal Thoughts:  No - denies any thoughts of hurting herself at this time   Homicidal Thoughts:  No  Memory:  Recent and remote grossly intact  Judgement:  Fair  Insight:  Fair  Psychomotor Activity:  Normal - walks slowly due to pain  Concentration:  Good  Recall:  Good  Fund of Knowledge:Good  Language: Good  Akathisia:  Negative  Handed:  Right  AIMS (if indicated):     Assets:  Communication Skills Desire for Improvement Resilience  ADL's:  Impaired  Cognition: WNL  Sleep:  Number of Hours: 5.5     Current Medications: Current Facility-Administered Medications  Medication Dose Route  Frequency Provider Last Rate Last Dose  . DULoxetine (CYMBALTA) DR capsule 20 mg  20 mg Oral Daily Jenne Campus, MD   20 mg at 02/14/15 6378  . multivitamin with minerals tablet 1 tablet  1 tablet Oral Daily Clayton Bibles, RD   1 tablet at 02/14/15 0836  . nicotine (NICODERM CQ - dosed in mg/24 hours) patch 21 mg  21 mg Transdermal Q0600 Niel Hummer, NP   21 mg at 02/14/15 0631  . oxyCODONE (Oxy IR/ROXICODONE) immediate release tablet 10 mg  10 mg Oral Q4H PRN Laverle Hobby, PA-C   10 mg at 02/14/15 0650  . traZODone (DESYREL) tablet 50 mg  50 mg Oral QHS PRN Niel Hummer, NP   50 mg at 02/13/15 2245    Lab Results:  Results for orders placed  or performed during the hospital encounter of 02/12/15 (from the past 48 hour(s))  Basic metabolic panel     Status: Abnormal   Collection Time: 02/14/15  6:55 AM  Result Value Ref Range   Sodium 139 135 - 145 mmol/L   Potassium 3.4 (L) 3.5 - 5.1 mmol/L   Chloride 109 96 - 112 mmol/L   CO2 20 19 - 32 mmol/L   Glucose, Bld 93 70 - 99 mg/dL   BUN 8 6 - 23 mg/dL   Creatinine, Ser 0.54 0.50 - 1.10 mg/dL   Calcium 8.2 (L) 8.4 - 10.5 mg/dL   GFR calc non Af Amer >90 >90 mL/min   GFR calc Af Amer >90 >90 mL/min    Comment: (NOTE) The eGFR has been calculated using the CKD EPI equation. This calculation has not been validated in all clinical situations. eGFR's persistently <90 mL/min signify possible Chronic Kidney Disease.    Anion gap 10 5 - 15    Comment: Performed at [facility]    Physical Findings: AIMS: Facial and Oral Movements Muscles of Facial Expression: None, normal Lips and Perioral Area: None, normal Jaw: None, normal Tongue: None, normal,Extremity Movements Upper (arms, wrists, hands, fingers): None, normal Lower (legs, knees, ankles, toes): None, normal, Trunk Movements Neck, shoulders, hips: None, normal, Overall Severity Severity of abnormal movements (highest score from questions above): None,  normal Incapacitation due to abnormal movements: None, normal Patient's awareness of abnormal movements (rate only patient's report): No Awareness, Dental Status Current problems with teeth and/or dentures?: No Does patient usually wear dentures?: No  CIWA:  CIWA-Ar Total: 2 COWS:  COWS Total Score: 2  Asssessment: Patient remains quite depressed, constricted in affect, irritable but not actively suicidal. Focused on pain management and does seem to be significantly uncomfortable with pain. Medications adjusted as per her home medications including restarting Synthroid (as per hospitalist at lower dose), Arimidex 1 mg QDAY for history of breast cancer. Will also increase oxycodone to better manage pain. Continue Cymbalta at 30 mg QDAY.  Treatment Plan Summary: Daily contact with patient to assess and evaluate symptoms and progress in treatment, Medication management, Plan continue inpatient treatment  and medications as below    Increase Cymbalta to 30 mg QDAY Start Levothyroxine 75 mcg QDAY  Start Arimidex 1 mg QDAY Increase Oxycodone to 20 mg Q6 hours for pain    Medical Decision Making:  Established Problem, Stable/Improving (1), Review of Psycho-Social Stressors (1), Review or order clinical lab tests (1), Review of Medication Regimen & Side Effects (2) and Review of New Medication or Change in Dosage (2)     COBOS, FERNANDO 02/14/2015, 10:59 AM

## 2015-02-15 DIAGNOSIS — I5032 Chronic diastolic (congestive) heart failure: Secondary | ICD-10-CM

## 2015-02-15 DIAGNOSIS — M7989 Other specified soft tissue disorders: Secondary | ICD-10-CM | POA: Insufficient documentation

## 2015-02-15 LAB — BRAIN NATRIURETIC PEPTIDE: B Natriuretic Peptide: 116.6 pg/mL — ABNORMAL HIGH (ref 0.0–100.0)

## 2015-02-15 MED ORDER — DULOXETINE HCL 20 MG PO CPEP
40.0000 mg | ORAL_CAPSULE | Freq: Every day | ORAL | Status: DC
  Administered 2015-02-16: 40 mg via ORAL
  Filled 2015-02-15 (×3): qty 2

## 2015-02-15 MED ORDER — FUROSEMIDE 40 MG PO TABS
40.0000 mg | ORAL_TABLET | Freq: Every day | ORAL | Status: DC
  Administered 2015-02-16 – 2015-02-19 (×4): 40 mg via ORAL
  Filled 2015-02-15 (×6): qty 1

## 2015-02-15 MED ORDER — GABAPENTIN 100 MG PO CAPS
100.0000 mg | ORAL_CAPSULE | Freq: Two times a day (BID) | ORAL | Status: DC
  Administered 2015-02-15 – 2015-02-17 (×4): 100 mg via ORAL
  Filled 2015-02-15 (×9): qty 1

## 2015-02-15 NOTE — Clinical Social Work Note (Signed)
CSW met w patient, explained that [employer] does not pay for ALF placement.  Patient aware her income is too high for Medicaid.  Gave patient list of low income senior housing options.  Patient declined [facility] screen.  Jacqueline Shell, LCSW Clinical Social Worker

## 2015-02-15 NOTE — Progress Notes (Signed)
BNP reviewed, looks better than her last value, no further changes in her regimen, rest of plan as per Dr. Charlies Silvers yesterday with Lasix and changed Synthroid dose.  Please call with questions, thank you for this consult   Lesle Faron M. Cruzita Lederer, MD [facility] [phone]

## 2015-02-15 NOTE — Clinical Social Work Note (Signed)
CSW met with patient to provide information on [facility].  Patient advised she will not be able to discharge there as she is unable to care for herself.  She was informed the only options we can recommend is an assisted living, which patient refused earlier in the week.  Patient shared she is not close to being ready to leave here and may have to consider an AL. She shared she spoke with her daughter last night and daughter did not make any suggestions about her coming to [place].  She suggested CSW follow through with referral for AL>

## 2015-02-15 NOTE — Progress Notes (Signed)
D: Patient in her room on approach.  Patient states sh continues to be in pain and states her lower extremity edema has gotten worse.  Patient has 3+ pitting edema to bilateral lower extremities.  The foot pf patient's bed was elevated and patient has been elevating legs while in bed.  Patient has been using to wheelchair to get around on the unit. Patient rates her depression and anxiety 9/10.  Patient appear flat, sad, and depressed.  Patient denies SI/HI and denies AVH.   A: Staff to monitor Q 15 mins for safety.  Encouragement and support offered.  Scheduled medications administered per orders.  Oxycodone administered prn for pain.  Trazodone administered prn for sleep. R: Patient remains safe on the unit.  Patient attended group tonight.  Patient taking administered medications.

## 2015-02-15 NOTE — Progress Notes (Signed)
1:1 Observation note: Pt sitting in dayroom this morning with c/o edema to her lower extremities. Pt reported that the edema is worsening. Writer encouraged pt to elevated legs and also provided pt with ice packs. Pt was also administered lasix this morning for swelling as ordered. Pt continues to ambulate using cane d/t unsteady gait. Mac MHT, verbalized to writer that the pt lost control of her balance this morning while in the restroom and required assistants with ADLs. Pt did not fall but was at high risk for falling if MHT wasn't present at the time. Writer spoke to [employer], MD about pt increased edema to lower extremities and pt unsteady gait this morning. Pt placed on a 1:1 observation for safety as ordered by MD.

## 2015-02-15 NOTE — Progress Notes (Addendum)
Patient ID: Jacqueline Moyer, female   DOB: 28-Sep-1956, 59 y.o.   MRN: [id number] [facility] MD Progress Note  02/15/2015 11:15 AM Jacqueline Moyer  MRN:  [id number]  Subjective: Patient reports ongoing depression, sadness, and states she is upset because during a recent phone conversation with her daughter, who lives in [place], she felt that daughter does not want her to go to [place] to live with her. She states she feels " like I am all alone in the world". She does state her pain is better and is not as concerned with it today. She states that in spite of lasix she feels her edema, which affects lower extremities bilaterally, is getting worse .  Objective: Patient seen and care discussed with treatment team.  Patient remains depressed, sad, vaguely irritable.  She is on a one to one now, started yesterday due to fall risk / safety.  Patient is , as above, depressed, ruminative about feeling rejected by her daughter, and tearful. She does deny any suicidal plan or intention on unit and contracts for safety at present. She has noticeable lower extremity edema, which contributes to ambulation difficulties.  She denies medication side effects. Some group participation.     Principal Problem: MDD (major depressive disorder), recurrent severe, without psychosis Diagnosis:   Patient Active Problem List   Diagnosis Date Noted  . Drug-induced hepatitis [K75.9] 02/08/2015  . Suicide attempt by acetaminophen overdose [T39.1X2A]   . Overdose [T50.901A] 02/07/2015  . Intentional acetaminophen overdose [T39.1X2A] 02/07/2015  . Cellulitis and abscess of leg [L02.419, L03.119] 02/07/2015  . AKI (acute kidney injury) [N17.9] 02/07/2015  . Transaminasemia [R74.0] 02/07/2015  . MDD (major depressive disorder), recurrent severe, without psychosis [F33.2] 02/07/2015  . Paresthesias [R20.2] 01/28/2015  . 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 Dysphagia [R13.10] 01/28/2015  .  Breast cancer of lower-outer quadrant of left female breast [C50.512] 12/22/2014  . Proximal muscle weakness [M62.89] 12/09/2014  . Protein-calorie malnutrition, severe [E43] 11/21/2014  . Hypokalemia [E87.6] 11/19/2014  . Benign essential HTN [I10] 11/19/2014  . UTI (lower urinary tract infection) [N39.0] 11/19/2014  . Abdominal pain [R10.9] 11/19/2014  . GERD [K21.9] 08/29/2008  . History of malignant neoplasm of large intestine [Z85.038] 08/29/2008   Total Time spent with patient: 25 minutes    Past Medical History:  Past Medical History  Diagnosis Date  . Hypertension   . Thyroid disease     hyperthyroidism  . Chronic back pain   . Chronic neck pain   . Depression   . COPD (chronic obstructive pulmonary disease)   . Anemia   . Cancer approx 2006    colon cancer  . Blood transfusion without reported diagnosis   . GERD (gastroesophageal reflux disease)   . Arthritis   . Anxiety     Past Surgical History  Procedure Laterality Date  . Cholecystectomy    . Tonsillectomy    . Abdominal hysterectomy    . Hernia repair    . Colon surgery  approx. 2006    for colon cancer  . Gastric bypass  2000   Family History:  Family History  Problem Relation Age of Onset  . Colon cancer Father 57  . Prostate cancer Brother    Social History:  History  Alcohol Use No    Comment: denied      History  Drug Use No    Comment: denied     History   Social History  . Marital Status: Divorced  Spouse Name: Leanna Sato   . Number of Children: 1  . Years of Education: 12+   Occupational History  . Disabled     Social History Main Topics  . Smoking status: Former Smoker -- 1.00 packs/day for 40 years    Types: Cigarettes    Quit date: 12/27/2013  . Smokeless tobacco: Current User    Last Attempt to Quit: 04/04/2013  . Alcohol Use: No     Comment: denied   . Drug Use: No     Comment: denied   . Sexual Activity: No   Other Topics Concern  . None   Social  History Narrative   Patient lives at home with life partner Leanna Sato    Patient has 1 child    Patient is disabled    Patient has a BS degree      Additional History:    Sleep: Fair  Appetite:  Improving   Assessment:   Musculoskeletal: Strength & Muscle Tone: within normal limits Gait & Station: normal Patient leans: N/A   Psychiatric Specialty Exam: Physical Exam  ROS - no SOB,  No CP, no  cough.  No orthopnea, no PND, (+) Hip and leg pain, slow gait. Depression, no seizures. No rash. No fever, chills.   Blood pressure 114/73, pulse 82, temperature 98.1 F (36.7 C), temperature source Oral, resp. rate 16, height 5' 5.75" (1.67 m), weight 182 lb (82.555 kg).Body mass index is 29.6 kg/(m^2).  General Appearance: Fairly Groomed  Engineer, water::  Good  Speech:  Normal Rate  Volume:  Normal  Mood:  Depressed  Affect:  Constricted and irritable- tearful when discussing recent conversation with daughter   Thought Process:  Goal Directed and Linear  Orientation:  Full (Time, Place, and Person)  Thought Content:  No delusions, no hallucinations, not internally preoccupied  Suicidal Thoughts:  No - denies any thoughts of hurting herself at this time   Homicidal Thoughts:  No  Memory:  Recent and remote grossly intact  Judgement:  Fair  Insight:  Fair  Psychomotor Activity:  Normal - walks slowly due to pain  Concentration:  Good  Recall:  Good  Fund of Knowledge:Good  Language: Good  Akathisia:  Negative  Handed:  Right  AIMS (if indicated):     Assets:  Communication Skills Desire for Improvement Resilience  ADL's:  Impaired  Cognition: WNL  Sleep:  Number of Hours: 5.25     Current Medications: Current Facility-Administered Medications  Medication Dose Route Frequency Provider Last Rate Last Dose  . anastrozole (ARIMIDEX) tablet 1 mg  1 mg Oral Daily Jenne Campus, MD   1 mg at 02/15/15 0834  . DULoxetine (CYMBALTA) DR capsule 30 mg  30 mg Oral Daily  Jenne Campus, MD   30 mg at 02/15/15 0834  . furosemide (LASIX) tablet 20 mg  20 mg Oral Daily Robbie Lis, MD   20 mg at 02/15/15 0834  . levothyroxine (SYNTHROID, LEVOTHROID) tablet 75 mcg  75 mcg Oral QAC breakfast Robbie Lis, MD   75 mcg at 02/15/15 [phone]  . multivitamin with minerals tablet 1 tablet  1 tablet Oral Daily Clayton Bibles, RD   1 tablet at 02/15/15 [phone]  . nicotine (NICODERM CQ - dosed in mg/24 hours) patch 21 mg  21 mg Transdermal Q0600 Niel Hummer, NP   21 mg at 02/14/15 0631  . oxyCODONE (Oxy IR/ROXICODONE) immediate release tablet 20 mg  20 mg Oral Q6H PRN Felicita Gage  A Cobos, MD   20 mg at 02/15/15 1014  . pantoprazole (PROTONIX) EC tablet 40 mg  40 mg Oral Daily Jenne Campus, MD   40 mg at 02/15/15 0834  . promethazine (PHENERGAN) tablet 25 mg  25 mg Oral Q6H PRN Kerrie Buffalo, NP      . traZODone (DESYREL) tablet 25 mg  25 mg Oral QHS PRN Jenne Campus, MD   25 mg at 02/14/15 2243    Lab Results:  Results for orders placed or performed during the hospital encounter of 02/12/15 (from the past 48 hour(s))  Basic metabolic panel     Status: Abnormal   Collection Time: 02/14/15  6:55 AM  Result Value Ref Range   Sodium 139 135 - 145 mmol/L   Potassium 3.4 (L) 3.5 - 5.1 mmol/L   Chloride 109 96 - 112 mmol/L   CO2 20 19 - 32 mmol/L   Glucose, Bld 93 70 - 99 mg/dL   BUN 8 6 - 23 mg/dL   Creatinine, Ser 0.54 0.50 - 1.10 mg/dL   Calcium 8.2 (L) 8.4 - 10.5 mg/dL   GFR calc non Af Amer >90 >90 mL/min   GFR calc Af Amer >90 >90 mL/min    Comment: (NOTE) The eGFR has been calculated using the CKD EPI equation. This calculation has not been validated in all clinical situations. eGFR's persistently <90 mL/min signify possible Chronic Kidney Disease.    Anion gap 10 5 - 15    Comment: Performed at [facility]  Brain natriuretic peptide     Status: Abnormal   Collection Time: 02/15/15  6:45 AM  Result Value Ref Range   B Natriuretic  Peptide 116.6 (H) 0.0 - 100.0 pg/mL    Comment: Performed at [facility]    Physical Findings: AIMS: Facial and Oral Movements Muscles of Facial Expression: None, normal Lips and Perioral Area: None, normal Jaw: None, normal Tongue: None, normal,Extremity Movements Upper (arms, wrists, hands, fingers): None, normal Lower (legs, knees, ankles, toes): None, normal, Trunk Movements Neck, shoulders, hips: None, normal, Overall Severity Severity of abnormal movements (highest score from questions above): None, normal Incapacitation due to abnormal movements: None, normal Patient's awareness of abnormal movements (rate only patient's report): No Awareness, Dental Status Current problems with teeth and/or dentures?: No Does patient usually wear dentures?: No  CIWA:  CIWA-Ar Total: 2 COWS:  COWS Total Score: 2  Asssessment:  Patient is depressed, sad, and today states she feels worse after she felt that daughter rejected the idea of her going to [place] to live with daughter following discharge. Ongoing passive SI but denies any plan or intention of hurting self here and contracts for safety on unit .  Tolerating medications well .  Edema reported as worsening, but no shortness of breath, coughing or acute distress, vitals stable . Treatment Plan Summary: Daily contact with patient to assess and evaluate symptoms and progress in treatment, Medication management, Plan continue inpatient treatment  and medications as below    Increase Cymbalta to  40 mg QDAY Continue Levothyroxine 75 mcg QDAY  Continue Arimidex 1 mg QDAY Continue  Oxycodone to 20 mg Q6 hours for pain  Start Neurontin 100 mgrs BID to address pain, and which may also help anxiety.  ( Medication side effects have been reviewed with patient )  Have discussed case with Hospitalist Service, appreciate hospitalist involvement .    Medical Decision Making:  Established Problem, Stable/Improving (1), Review of  Psycho-Social Stressors (1), Review or order clinical lab tests (1), Review of Medication Regimen & Side Effects (2) and Review of New Medication or Change in Dosage (2)     COBOS, FERNANDO 02/15/2015, 11:15 AM

## 2015-02-15 NOTE — Progress Notes (Signed)
1:1 Observation note: Pt observed in the room with sitter present. Pt has been using her wheelchair this evening d/t worsening lower extremity. Pt continues to elevate legs and apply ice packs. Pt continues to report chronic back pain. Pt given scheduled Neurontin as ordered for pain. Pt remains on 1:1 observation for safety.

## 2015-02-15 NOTE — [facility] Group Notes (Signed)
[place] LCSW Group Therapy  [facility] 1:15 - 2:30 PM  02/15/2015   Type of Therapy:  Group Therapy  Participation Level: Active  Participation Quality:  Attentive  Affect:  Appropriate  Cognitive:  Appropriate  Insight:  Developing/Improving   Engagement in Therapy:  Developing/Improving   Modes of Intervention:  Discussion, Education, Exploration, Problem-Solving, Rapport Building, Support   Summary of Progress/Problems:   Patient was attentive to speaker from the [employer] [facility] as he shared his story of dealing with mental health/substance abuse issues and overcoming it by working a recovery program.  Patient expressed interest in their programs and services and received information on their agency.    Edwyna Shell, LCSW Clinical Social Worker   02/15/2015

## 2015-02-15 NOTE — Progress Notes (Signed)
Pt seen and examined at bedside. She has what looks like chronic edema with some superimposed chronic skin changes. Symmetrical extremities. Changed dose of lasix to 40 mg daily instead of 20 mg daily.  Jacqueline Moyer [facility] [phone]

## 2015-02-15 NOTE — Progress Notes (Signed)
Adult Psychoeducational Group Note  Date:  02/15/2015 Time:  0900  Group Topic/Focus:  Nursing group  Participation Level:  Minimal  Participation Quality:  Inattentive  Affect:  Flat and Irritable  Cognitive:  Lacking  Insight: Lacking  Engagement in Group:  Lacking  Modes of Intervention:  Discussion and Education  Additional Comments:    Jamia Hoban L 02/15/2015, 1:24 PM

## 2015-02-15 NOTE — Progress Notes (Signed)
1:1 Observation note: Pt observed lying in bed resting with sitter present at bedside. Pt do not appear to be in distress. Resp are even and unlabored. Pt remains on 1:1 observation for high fall risk and safety until d/c'd. Pt safety maintained.

## 2015-02-15 NOTE — Progress Notes (Signed)
Patient nursing 1:1 note: Patient at [facility] group sitting in wheelchair.  Patient in engaged.  Patient on 1:1 for fall risk safety.  Patient remains safe on the unit

## 2015-02-16 MED ORDER — DULOXETINE HCL 60 MG PO CPEP
60.0000 mg | ORAL_CAPSULE | Freq: Every day | ORAL | Status: DC
  Administered 2015-02-17 – 2015-02-23 (×7): 60 mg via ORAL
  Filled 2015-02-16 (×8): qty 1

## 2015-02-16 NOTE — Progress Notes (Signed)
Patient ID: Jacqueline Moyer, female   DOB: 23-Jul-1956, 59 y.o.   MRN: [id number] [facility] MD Progress Note  02/16/2015 5:13 PM Calleen Alvis  MRN:  [id number]  Subjective:  Patient states she is starting to feel " a little bit better" but still quite depressed, and still focused on psychosocial stressors. She states lower extremity edema has started to decrease and she has had more diuresis with increased lasix dose, and ability to ambulate is slightly improved..  Objective: Patient seen and care discussed with treatment team.  She has been visible on unit, and has been less isolative. Remains on one to one observation due to high risk of falls. As noted, lower extremity edema is starting to improve, and diuresis is improved on higher dose of lasix. Denies medication side effects, except for some nausea, alleviated with phenergan. Of note, Social Worker has spoken with daughter, who lives in [place], and who is agreeing for patient to  Move down to live with her. However, this will require for patient to close out bank accounts, pick up belongings and other errands that she continues to find overwhelming at this time. Daughter has stated she is unsure when she could travel up to [place] to assist in above.     Principal Problem: MDD (major depressive disorder), recurrent severe, without psychosis Diagnosis:   Patient Active Problem List   Diagnosis Date Noted  . Swelling of lower extremity [M79.89]   . Drug-induced hepatitis [K75.9] 02/08/2015  . Suicide attempt by acetaminophen overdose [T39.1X2A]   . Overdose [T50.901A] 02/07/2015  . Intentional acetaminophen overdose [T39.1X2A] 02/07/2015  . Cellulitis and abscess of leg [L02.419, L03.119] 02/07/2015  . AKI (acute kidney injury) [N17.9] 02/07/2015  . Transaminasemia [R74.0] 02/07/2015  . MDD (major depressive disorder), recurrent severe, without psychosis [F33.2] 02/07/2015  . Paresthesias [R20.2] 01/28/2015  . Hereditary and idiopathic  peripheral neuropathy [G60.9] 01/28/2015  . Dysphagia [R13.10] 01/28/2015  . Breast cancer of lower-outer quadrant of left female breast [C50.512] 12/22/2014  . Proximal muscle weakness [M62.89] 12/09/2014  . Protein-calorie malnutrition, severe [E43] 11/21/2014  . Hypokalemia [E87.6] 11/19/2014  . Benign essential HTN [I10] 11/19/2014  . UTI (lower urinary tract infection) [N39.0] 11/19/2014  . Abdominal pain [R10.9] 11/19/2014  . GERD [K21.9] 08/29/2008  . History of malignant neoplasm of large intestine [Z85.038] 08/29/2008   Total Time spent with patient: 25 minutes    Past Medical History:  Past Medical History  Diagnosis Date  . Hypertension   . Thyroid disease     hyperthyroidism  . Chronic back pain   . Chronic neck pain   . Depression   . COPD (chronic obstructive pulmonary disease)   . Anemia   . Cancer approx 2006    colon cancer  . Blood transfusion without reported diagnosis   . GERD (gastroesophageal reflux disease)   . Arthritis   . Anxiety     Past Surgical History  Procedure Laterality Date  . Cholecystectomy    . Tonsillectomy    . Abdominal hysterectomy    . Hernia repair    . Colon surgery  approx. 2006    for colon cancer  . Gastric bypass  2000   Family History:  Family History  Problem Relation Age of Onset  . Colon cancer Father 43  . Prostate cancer Brother    Social History:  History  Alcohol Use No    Comment: denied      History  Drug Use No    Comment: denied  History   Social History  . Marital Status: Divorced    Spouse Name: Leanna Sato   . Number of Children: 1  . Years of Education: 12+   Occupational History  . Disabled     Social History Main Topics  . Smoking status: Former Smoker -- 1.00 packs/day for 40 years    Types: Cigarettes    Quit date: 12/27/2013  . Smokeless tobacco: Current User    Last Attempt to Quit: 04/04/2013  . Alcohol Use: No     Comment: denied   . Drug Use: No     Comment:  denied   . Sexual Activity: No   Other Topics Concern  . None   Social History Narrative   Patient lives at home with life partner Leanna Sato    Patient has 1 child    Patient is disabled    Patient has a BS degree      Additional History:    Sleep: Fair  Appetite:  Improving   Assessment:   Musculoskeletal: Strength & Muscle Tone: within normal limits Gait & Station: normal Patient leans: N/A   Psychiatric Specialty Exam: Physical Exam  ROS - no SOB,  No CP, no  cough.  No orthopnea, no PND, (+) Hip and leg pain, slow gait. Depression, no seizures. No rash. No fever, chills.  * of note, states pain is improving .   Blood pressure 112/70, pulse 60, temperature 98.1 F (36.7 C), temperature source Oral, resp. rate 19, height 5' 5.75" (1.67 m), weight 182 lb (82.555 kg).Body mass index is 29.6 kg/(m^2).  General Appearance: Fairly Groomed  Engineer, water::  Good  Speech:  Normal Rate  Volume:  Normal  Mood:  Depressed, but slightly improved compared to admission  Affect:  still constricted, less irritable- does smile briefly at times   Thought Process:  Goal Directed and Linear  Orientation:  Full (Time, Place, and Person)  Thought Content:  No delusions, no hallucinations, not internally preoccupied  Suicidal Thoughts:  No - denies any thoughts of hurting herself at this time   Homicidal Thoughts:  No  Memory:  Recent and remote grossly intact  Judgement:  Fair  Insight:  Fair  Psychomotor Activity:  Normal - walks slowly due to pain  Concentration:  Good  Recall:  Good  Fund of Knowledge:Good  Language: Good  Akathisia:  Negative  Handed:  Right  AIMS (if indicated):     Assets:  Communication Skills Desire for Improvement Resilience  ADL's:  Impaired  Cognition: WNL  Sleep:  Number of Hours: 4.25     Current Medications: Current Facility-Administered Medications  Medication Dose Route Frequency Provider Last Rate Last Dose  . anastrozole (ARIMIDEX)  tablet 1 mg  1 mg Oral Daily Jenne Campus, MD   1 mg at 02/16/15 0816  . DULoxetine (CYMBALTA) DR capsule 40 mg  40 mg Oral Daily Jenne Campus, MD   40 mg at 02/16/15 0816  . furosemide (LASIX) tablet 40 mg  40 mg Oral Daily Robbie Lis, MD   40 mg at 02/16/15 0816  . gabapentin (NEURONTIN) capsule 100 mg  100 mg Oral BID Jenne Campus, MD   100 mg at 02/16/15 1611  . levothyroxine (SYNTHROID, LEVOTHROID) tablet 75 mcg  75 mcg Oral QAC breakfast Robbie Lis, MD   75 mcg at 02/16/15 [phone]  . multivitamin with minerals tablet 1 tablet  1 tablet Oral Daily Clayton Bibles, RD   1  tablet at 02/16/15 0816  . nicotine (NICODERM CQ - dosed in mg/24 hours) patch 21 mg  21 mg Transdermal Q0600 Niel Hummer, NP   21 mg at 02/16/15 0814  . oxyCODONE (Oxy IR/ROXICODONE) immediate release tablet 20 mg  20 mg Oral Q6H PRN Jenne Campus, MD   20 mg at 02/16/15 1612  . pantoprazole (PROTONIX) EC tablet 40 mg  40 mg Oral Daily Jenne Campus, MD   40 mg at 02/16/15 0816  . promethazine (PHENERGAN) tablet 25 mg  25 mg Oral Q6H PRN Kerrie Buffalo, NP   25 mg at 02/16/15 1701  . traZODone (DESYREL) tablet 25 mg  25 mg Oral QHS PRN Jenne Campus, MD   25 mg at 02/15/15 2209    Lab Results:  Results for orders placed or performed during the hospital encounter of 02/12/15 (from the past 48 hour(s))  Brain natriuretic peptide     Status: Abnormal   Collection Time: 02/15/15  6:45 AM  Result Value Ref Range   B Natriuretic Peptide 116.6 (H) 0.0 - 100.0 pg/mL    Comment: Performed at [facility]    Physical Findings: AIMS: Facial and Oral Movements Muscles of Facial Expression: None, normal Lips and Perioral Area: None, normal Jaw: None, normal Tongue: None, normal,Extremity Movements Upper (arms, wrists, hands, fingers): None, normal Lower (legs, knees, ankles, toes): None, normal, Trunk Movements Neck, shoulders, hips: None, normal, Overall Severity Severity of abnormal  movements (highest score from questions above): None, normal Incapacitation due to abnormal movements: None, normal Patient's awareness of abnormal movements (rate only patient's report): No Awareness, Dental Status Current problems with teeth and/or dentures?: No Does patient usually wear dentures?: No  CIWA:  CIWA-Ar Total: 2 COWS:  COWS Total Score: 2  Asssessment:  Patient remains depressed and anxious about her discharge options. She wants to go to [place] and live with her daughter, who is agreeing , but even thinking of the errands and things she needs to take care of before traveling is still quite overwhelming. She is less severely depressed, and at this time not suicidal, but remains sad and tearful at times.  She is not psychotic. Pain is better, and peripheral edema, although still present and significant, is improved with lasix.  So far has tolerated Cymbalta trial well.   Treatment Plan Summary: Daily contact with patient to assess and evaluate symptoms and progress in treatment, Medication management, Plan continue inpatient treatment  and medications as below    Increase Cymbalta to  60  mg QDAY Continue Levothyroxine 75 mcg QDAY  Continue Arimidex 1 mg QDAY Continue  Oxycodone to 20 mg Q6 hours for pain  Continue Neurontin 100 mgrs BID   Medical Decision Making:  Established Problem, Stable/Improving (1), Review of Psycho-Social Stressors (1), Review or order clinical lab tests (1), Review of Medication Regimen & Side Effects (2) and Review of New Medication or Change in Dosage (2)     COBOS, FERNANDO 02/16/2015, 5:13 PM

## 2015-02-16 NOTE — Progress Notes (Signed)
D: Patient resting in bed with eyes closed.  Respirations even and unlabored.  Patient appears to be in no apparent distress. A: Staff to monitor Q 15 mins for safety.  Patient remains on 1:1 for fall risk safety R:Patient remains safe on the unit.

## 2015-02-16 NOTE — Progress Notes (Signed)
D:Patient in her room in bed awake on approach.  Patient had been asleep previously.  Patient states she had an ok day.  Patient states her legs look better which they do.  The selling in her bilateral lower extremities has decreased significantly.  Patient states she is worried about her money ad her back account that her ex boyfriend has access to. Patient still rates depression and anxiety 9/10.   Patient denies SI/HI and denies AVH.  Patient still appears depressed and sad. A: Staff to monitor Q 15 mins for safety.  Encouragement and support offered.  No Scheduled medications administered per orders.  Oxycodone administered prn for pain.  Trazodone administered prn for sleep. R: Patient remains safe on the unit.  Patient did not attend group tonight.  Patient not visible on the unit.  No scheduled medications administered but prn medications administered and taking meds.

## 2015-02-16 NOTE — Progress Notes (Signed)
D: Kiki denies SI/HI/AVH. In the a.m., she said her chronic back pain is a 9, about three hours after PRN oxycodone given for pain. She rates her depression 9, hopelessness 10, anxiety 9. Pt said she did not attend discharge planning group because of her nausea -- she says she has attended all other groups. She did not attend other groups today, instead remaining in her room, but by 1630 was out in the dayroom. She says she feels "a little" less depressed. Her BLE edema appears to have improved, and pt reports she is diuresing. Her affect remains blunted. She is polite. After lunch, pt began dry heaving. It wasn't yet time for Phenergan, so pt used a wet washcloth on her head and soon said she was feeling better. She said the post-meal nausea has been ongoing.  A: Medications given as ordered, including Phenergan for nausea and oxycodone for pain. Q15 safety checks maintained. Support/encouragement provided.   R: Pt remains safe and continues with treatment. Will continue to monitor for needs/safety.

## 2015-02-16 NOTE — Clinical Social Work Note (Addendum)
Patient requested that CSW contact her daughter Lars Mage [phone] regarding depositing money into her bank account so that she can get her car out of the impound lot. CSW spoke briefly with daughter who states that she is not able to deposit the money until patient providers her with the bank account information. Daughter states that patient can come stay with her in the future but is unsure of when she will be able to come up to [place] from [place] to pick patient up. Daughter has also been in contact with patient's ex-boyfriend who informed her that he does not have patient's checkbooks or debit card which patient is requesting that he bring to the hospital.   Tilden Fossa, MSW, [place] Worker [facility] [phone]

## 2015-02-16 NOTE — Progress Notes (Signed)
1:1 nursing note D: Patient resting in bed with eyes closed.  Respirations even and unlabored.  Patient appears to be in no apparent distress. A: Staff to monitor Q 15 mins for safety.  Patient remains on 1:1 for safety. R:Patient remains safe on the unit.

## 2015-02-16 NOTE — Progress Notes (Signed)
Recreation Therapy Notes   Date: 03.25.2016 Time: 9:30am Location: [address] Group Room   Group Topic: Stress Management  Goal Area(s) Addresses:  Patient will actively participate in stress management techniques presented during session.   Behavioral Response: Did not attend.    Laureen Ochs Christene Pounds, LRT/CTRS  Lane Hacker 02/16/2015 1:57 PM

## 2015-02-16 NOTE — BHH Group Notes (Signed)
[facility] LCSW Aftercare Discharge Planning Group Note  02/16/2015  8:45 AM  Participation Quality: Did Not Attend. Patient invited to participate but declined.  Tilden Fossa, MSW, [place] Worker [facility] [phone]

## 2015-02-16 NOTE — Progress Notes (Signed)
Patient ID: Jacqueline Moyer, female   DOB: 1956/07/12, 59 y.o.   MRN: [id number] PER STATE REGULATIONS 482.30  THIS CHART WAS REVIEWED FOR MEDICAL NECESSITY WITH RESPECT TO THE PATIENT'S ADMISSION/ DURATION OF STAY.  NEXT REVIEW DATE: 02/20/2015  Chauncy Lean, RN, BSN CASE MANAGER

## 2015-02-16 NOTE — Progress Notes (Signed)
Patient 1:1 Note D: Patient resting in bed with eyes closed.  Respirations even and unlabored.  Patient appears to be in no apparent distress. A: Staff to monitor Q 15 mins for safety.  Patient remains on 1:1 for safety. R:Patient remains safe on the unit.

## 2015-02-16 NOTE — Tx Team (Signed)
Interdisciplinary Treatment Plan Update (Adult) Date: 02/16/2015   Time Reviewed: 9:30 AM  Progress in Treatment: Attending groups: Minimally Participating in groups: Yes Taking medication as prescribed: Yes Tolerating medication: Yes Family/Significant other contact made: No, CSW assessing for appropriate contacts. Patient understands diagnosis: Yes Discussing patient identified problems/goals with staff: Yes Medical problems stabilized or resolved: Yes Denies suicidal/homicidal ideation: Yes Issues/concerns per patient self-inventory: Yes Other:  New problem(s) identified: N/A  Discharge Plan or Barriers:  3/25: CSW continuing to assess. Patient is currently homeless but states that she may have found housing in [facility]. Patient will follow up with outpatient services at discharge.  Reason for Continuation of Hospitalization:  Depression Anxiety Medication Stabilization   Comments: N/A  Estimated length of stay: 3-5 days  For review of initial/current patient goals, please see plan of care. Patient is a 59 year old Caucasian female admitted following an intentional overdose and increasing depression. Patient is recently homeless in [place] due to the breakup of her 8 year relationship. Patient will benefit from crisis stabilization, medication evaluation, group therapy, and psycho education in addition to case management for discharge planning. Patient and CSW reviewed pt's identified goals and treatment plan. Pt verbalized understanding and agreed to treatment plan.   Attendees: Patient:    Family:    Physician: Dr. Parke Poisson 02/16/2015 9:30 AM  Nursing: Phillis Knack; Kerby Nora , RN 02/16/2015 9:30 AM  Clinical Social Worker: Erasmo Downer Kirk Basquez, [place] 02/16/2015 9:30 AM  Other:  02/16/2015 9:30 AM  Other:  02/16/2015 9:30 AM  Other:  02/16/2015 9:30 AM  Other: Agustina Caroli Elmarie Shiley; Nena Polio NP 02/16/2015 9:30 AM  Other:           Scribe for Treatment Team:  Tilden Fossa, MSW, [place]

## 2015-02-16 NOTE — Progress Notes (Signed)
1:1 RN Note Maisa remains safe and free from falls. She experienced nausea after lunch but reported it resolved quickly w/o medication. She is resting with eyes closed, regular/even respirations. Safety sitter remains present. Safety maintained.

## 2015-02-16 NOTE — BHH Group Notes (Signed)
[place] LCSW Group Therapy 02/16/2015 1:15 PM Type of Therapy: Group Therapy Participation Level: Active  Participation Quality: Attentive, Sharing and Supportive  Affect: Depressed and Flat  Cognitive: Alert and Oriented  Insight: Developing/Improving and Engaged  Engagement in Therapy: Developing/Improving and Engaged  Modes of Intervention: Clarification, Confrontation, Discussion, Education, Exploration, Limit-setting, Orientation, Problem-solving, Rapport Building, Art therapist, Socialization and Support  Summary of Progress/Problems: The topic for today was feelings about relapse. Pt discussed what relapse prevention is to them and identified triggers that they are on the path to relapse. Pt processed their feeling towards relapse and was able to relate to peers. Pt discussed coping skills that can be used for relapse prevention. Patient identified her relapse behavior as depending on others to be happy. Patient was able to identify some of her positive attributes. She shared that she has little to no social support and has never engaged in therapy. CSW and other group members provided patient with emotional support and encouragement.  Tilden Fossa, MSW, [place] Worker [facility] [phone]

## 2015-02-16 NOTE — Progress Notes (Signed)
1:1 Nursing Note Pt remains safe and free from harm. She is presently in the dayroom with her safety sitter and has been given Phenergan to prevent post-dinner nausea. Breathing is regular and unlabored. Will continue to monitor for needs and safety.

## 2015-02-16 NOTE — Progress Notes (Signed)
1:1 Nursing note  D: Pt is resting in bed, reporting some relief from nausea as a result of PRN Phenergan given. Respirations even and unlabored. She reports she could not leave the unit for breakfast d/t her 1:1 status, and would like an order so she may leave unit for meals.   A: Q15 checks in place, with 1:1 safety sitter present.   R: Pt remains safe and free from falls.

## 2015-02-17 MED ORDER — GABAPENTIN 100 MG PO CAPS
100.0000 mg | ORAL_CAPSULE | Freq: Three times a day (TID) | ORAL | Status: DC
  Administered 2015-02-17 – 2015-02-20 (×9): 100 mg via ORAL
  Filled 2015-02-17 (×14): qty 1

## 2015-02-17 NOTE — Progress Notes (Signed)
1:1 Note D: Patient resting in bed with eyes closed.  Respirations even and unlabored.  Patient appears to be in no apparent distress. A: Staff to monitor Q 15 mins for safety.  Patient remains on 1:1 for safety. R:Patient remains safe on the unit.

## 2015-02-17 NOTE — Progress Notes (Signed)
Patient ID: Jacqueline Moyer, female   DOB: [birth year], 59 y.o.   MRN: [id number] [facility] [place] MD Progress Note  02/17/2015 2:44 PM Jacqueline Moyer  MRN:  [id number]  Subjective:  Patient  Reports improvement with regards to physical  Symptoms . States her edema is improved, her gait is improved, and less painful. States her pain is under better control. She states, however, that her depression remains high and that she still struggles with a sense of hopelessness.  She denies medication side effects.  Objective: Patient seen and care discussed with treatment team.  As noted in chart notes, staff has noted patient to remain depressed, constricted, flat. She has had some group participation. At this time patient  Remains depressed, constricted, and at times briefly tearful. She has been able to take some specific steps towards dealing with her stressors, for example, states she made a phone call regarding a newspaper add for a room rental and tentatively rented a room where she can live for a period of time after discharge. States her eventual goal is to move down to [place] so she can be closer to her daughter, but states" I know this is going to take time, I am going to need to get my breast surgery first, and arrange for the trip ". States she has been feeling more depressed regarding phone calls with daughter, who has been less supportive than she had hoped for. States daughter accused her of sending in less money than she did. States this issue caused her to feel briefly suicidal , but at this time denies suicidal ideations. No disruptive behaviors on unit. She denies any medication side effects.      Principal Problem: MDD (major depressive disorder), recurrent severe, without psychosis Diagnosis:   Patient Active Problem List   Diagnosis Date Noted  . Swelling of lower extremity [M79.89]   . Drug-induced hepatitis [K75.9] 02/08/2015  . Suicide attempt by acetaminophen overdose [T39.1X2A]   .  Overdose [T50.901A] 02/07/2015  . Intentional acetaminophen overdose [T39.1X2A] 02/07/2015  . Cellulitis and abscess of leg [L02.419, L03.119] 02/07/2015  . AKI (acute kidney injury) [N17.9] 02/07/2015  . Transaminasemia [R74.0] 02/07/2015  . MDD (major depressive disorder), recurrent severe, without psychosis [F33.2] 02/07/2015  . Paresthesias [R20.2] 01/28/2015  . 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 Dysphagia [R13.10] 01/28/2015  . Breast cancer of lower-outer quadrant of left female breast [C50.512] 12/22/2014  . Proximal muscle weakness [M62.89] 12/09/2014  . Protein-calorie malnutrition, severe [E43] 11/21/2014  . Hypokalemia [E87.6] 11/19/2014  . Benign essential HTN [I10] 11/19/2014  . UTI (lower urinary tract infection) [N39.0] 11/19/2014  . Abdominal pain [R10.9] 11/19/2014  . GERD [K21.9] 08/29/2008  . History of malignant neoplasm of large intestine [Z85.038] 08/29/2008   Total Time spent with patient: 20 minutes   Past Medical History:  Past Medical History  Diagnosis Date  . Hypertension   . Thyroid disease     hyperthyroidism  . Chronic back pain   . Chronic neck pain   . Depression   . COPD (chronic obstructive pulmonary disease)   . Anemia   . Cancer approx 2006    colon cancer  . Blood transfusion without reported diagnosis   . GERD (gastroesophageal reflux disease)   . Arthritis   . Anxiety     Past Surgical History  Procedure Laterality Date  . Cholecystectomy    . Tonsillectomy    . Abdominal hysterectomy    . Hernia repair    . Colon  surgery  approx. 2006    for colon cancer  . Gastric bypass  2000   Family History:  Family History  Problem Relation Age of Onset  . Colon cancer Father 52  . Prostate cancer Brother    Social History:  History  Alcohol Use No    Comment: denied      History  Drug Use No    Comment: denied     History   Social History  . Marital Status: Divorced    Spouse Name:  Leanna Sato   . Number of Children: 1  . Years of Education: 12+   Occupational History  . Disabled     Social History Main Topics  . Smoking status: Former Smoker -- 1.00 packs/day for 40 years    Types: Cigarettes    Quit date: 12/27/2013  . Smokeless tobacco: Current User    Last Attempt to Quit: 04/04/2013  . Alcohol Use: No     Comment: denied   . Drug Use: No     Comment: denied   . Sexual Activity: No   Other Topics Concern  . None   Social History Narrative   Patient lives at home with life partner Leanna Sato    Patient has 1 child    Patient is disabled    Patient has a BS degree      Additional History:    Sleep: Fair  Appetite:  Improving   Assessment:   Musculoskeletal: Strength & Muscle Tone: within normal limits Gait & Station: normal Patient leans: N/A   Psychiatric Specialty Exam: Physical Exam  ROS - no SOB,  No CP, no  cough.  No orthopnea, no PND, (+) Hip and leg pain, slow gait. Depression, no seizures. No rash. No fever, chills.  * of note, states pain is improving .   Blood pressure 119/61, pulse 60, temperature 98.8 F (37.1 C), temperature source Oral, resp. rate 19, height 5' 5.75" (1.67 m), weight 182 lb (82.555 kg).Body mass index is 29.6 kg/(m^2).  General Appearance: Fairly Groomed  Engineer, water::  Good  Speech:  Normal Rate  Volume:  Normal  Mood:  Depressed,   Affect:  Constricted  Thought Process:  Goal Directed and Linear  Orientation:  Full (Time, Place, and Person)  Thought Content:  No delusions, no hallucinations, not internally preoccupied  Suicidal Thoughts:  No - denies any thoughts of hurting herself at this time   Homicidal Thoughts:  No  Memory:  Recent and remote grossly intact  Judgement:  Fair  Insight:  Fair  Psychomotor Activity:  Normal - gait improving as pain subsides   Concentration:  Good  Recall:  Good  Fund of Knowledge:Good  Language: Good  Akathisia:  Negative  Handed:  Right  AIMS (if  indicated):     Assets:  Communication Skills Desire for Improvement Resilience  ADL's:  Impaired  Cognition: WNL  Sleep:  Number of Hours: 4.75     Current Medications: Current Facility-Administered Medications  Medication Dose Route Frequency Provider Last Rate Last Dose  . anastrozole (ARIMIDEX) tablet 1 mg  1 mg Oral Daily Jenne Campus, MD   1 mg at 02/17/15 0915  . DULoxetine (CYMBALTA) DR capsule 60 mg  60 mg Oral Daily Jenne Campus, MD   60 mg at 02/17/15 0915  . furosemide (LASIX) tablet 40 mg  40 mg Oral Daily Robbie Lis, MD   40 mg at 02/17/15 0915  . gabapentin (NEURONTIN) capsule  100 mg  100 mg Oral BID Jenne Campus, MD   100 mg at 02/17/15 0914  . levothyroxine (SYNTHROID, LEVOTHROID) tablet 75 mcg  75 mcg Oral QAC breakfast Robbie Lis, MD   75 mcg at 02/17/15 [phone]  . multivitamin with minerals tablet 1 tablet  1 tablet Oral Daily Clayton Bibles, RD   1 tablet at 02/17/15 0915  . nicotine (NICODERM CQ - dosed in mg/24 hours) patch 21 mg  21 mg Transdermal Q0600 Niel Hummer, NP   21 mg at 02/16/15 7322  . oxyCODONE (Oxy IR/ROXICODONE) immediate release tablet 20 mg  20 mg Oral Q6H PRN Jenne Campus, MD   20 mg at 02/17/15 1047  . pantoprazole (PROTONIX) EC tablet 40 mg  40 mg Oral Daily Jenne Campus, MD   40 mg at 02/17/15 0914  . promethazine (PHENERGAN) tablet 25 mg  25 mg Oral Q6H PRN Kerrie Buffalo, NP   25 mg at 02/17/15 1213  . traZODone (DESYREL) tablet 25 mg  25 mg Oral QHS PRN Jenne Campus, MD   25 mg at 02/16/15 2237    Lab Results:  No results found for this or any previous visit (from the past 48 hour(s)).  Physical Findings: AIMS: Facial and Oral Movements Muscles of Facial Expression: None, normal Lips and Perioral Area: None, normal Jaw: None, normal Tongue: None, normal,Extremity Movements Upper (arms, wrists, hands, fingers): None, normal Lower (legs, knees, ankles, toes): None, normal, Trunk Movements Neck, shoulders,  hips: None, normal, Overall Severity Severity of abnormal movements (highest score from questions above): None, normal Incapacitation due to abnormal movements: None, normal Patient's awareness of abnormal movements (rate only patient's report): No Awareness, Dental Status Current problems with teeth and/or dentures?: No Does patient usually wear dentures?: No  CIWA:  CIWA-Ar Total: 2 COWS:  COWS Total Score: 2  Asssessment:  Patient  Remains depressed and constricted in affect, ruminative about stressors. Does describe pain and ambulation as improved, and peripheral edema has subsided partially. She is more future oriented and states she has rented a room she can live in after discharge and that she wants to get lumpectomy for Breast CA treatment done before moving to [place]. No medication side effects.    Treatment Plan Summary: Daily contact with patient to assess and evaluate symptoms and progress in treatment, Medication management, Plan continue inpatient treatment  and medications as below    Continue  Cymbalta to  60  mg QDAY Continue Levothyroxine 75 mcg QDAY  Continue Arimidex 1 mg QDAY Continue  Oxycodone to 20 mg Q6 hours for pain  Increase  Neurontin to  100 mgrs TID   Medical Decision Making:  Established Problem, Stable/Improving (1), Review of Psycho-Social Stressors (1), Review or order clinical lab tests (1), Review of Medication Regimen & Side Effects (2) and Review of New Medication or Change in Dosage (2)     Avi Kerschner 02/17/2015, 2:44 PM

## 2015-02-17 NOTE — Progress Notes (Addendum)
1:1 Note  D: Patient in her room in bed patient still flat and depressed.  Patient states her pain is more manageable but states she has to take her pain medication consistently.  Patient states she has been up more today.  Patient rates depression and anxiety both 9.  Patient denies SI/HI and denies AVH.  Patient lower extremity edema still present but decreased.   A: Staff to monitor Q 15 mins for safety.  Encouragement and support offered.  No scheduled medications administered per orders. R: Patient remains safe on the unit.  Patient did not attend group tonight.  Patient not visible on the unit and interacting peers.

## 2015-02-17 NOTE — Progress Notes (Signed)
1:1 NOTE  D) Pt is sitting in the dayroom getting ready to attend group therapy. Pt remains with a flat affect and has little affect when interacting. Pt is profoundly depressed. Denies SI and HI. Pt is trying to get in touch with her daughter so that she can get her car back. As yet, has not been able to get in touch with her daughter. Pt requested her pain medication at 1045 and then Phenergan  At prior to lunch. A) Given support, reassurance and praise. Remains on a constant observation for her safety. R) Pt remains safe.

## 2015-02-17 NOTE — Progress Notes (Signed)
D: Patient resting in bed with eyes closed.  Respirations even and unlabored.  Patient appears to be in no apparent distress. A: Staff to monitor Q 15 mins for safety.  Patient remains on 1:1 for safety. R:  Patient remains safe on the unit.  

## 2015-02-17 NOTE — Progress Notes (Signed)
1:1 NOTE @0900  D) Pt sitting in the dayroom with her 1:1. Affect is flat and mood depressed. Pt states that she has stage 1 cancer in her breast and is trying to get healthy so that she can have surgery. Pt has no expression when sharing this information. Rates her depression, hopelessness and helplessness all at a 9. Denies SI presently. A) Given support, reassurance and praise. Provided with a brief 1:1. Encouragement provided. Remains on a 1:1 R) Pt remains safe.

## 2015-02-17 NOTE — Progress Notes (Signed)
Patient 1:1 Note D: Patient resting in bed with eyes closed.  Respirations even and unlabored.  Patient appears to be in no apparent distress.   A: Staff to monitor Q 15 mins for safety.  Patient remains on 1:1 for fall risk safety. R:Patient remains safe on the unit.

## 2015-02-17 NOTE — Progress Notes (Signed)
.  Psychoeducational Group Note    Date: 02/17/2015 Time:  1000  Goal Setting Purpose of Group: To be able to set a goal that is measurable and that can be accomplished in one day Participation Level:  Active  Participation Quality:  Attentive  Affect:  Appropriate  Cognitive:  Appropriate  Insight:  Improving  Engagement in Group:  Engaged  Additional Comments:  Pt attending the group, participating and providing feedback   Jacqueline Moyer

## 2015-02-17 NOTE — Progress Notes (Signed)
D) Pt continues to sit in the dayroom and interacts mainly with her 1:1. Has been appropriate within the milieu but remains with a flat affect and little response of affect with interaction. Verbalizes some hope of wanting to get stronger so she can have surgery, A) Pt remains on a 1:1 for her safety. R) Denies SI and HI.

## 2015-02-17 NOTE — [facility] Group Notes (Signed)
[place] Group Notes: (Clinical Social Work)   02/17/2015      Type of Therapy:  Group Therapy   Participation Level:  Did Not Attend despite MHT prompting   Selmer Dominion, LCSW 02/17/2015, 5:10 PM

## 2015-02-17 NOTE — Progress Notes (Signed)
Psychoeducational Group Note  Date: 02/17/2015 Time:  1015  Group Topic/Focus:  Identifying Needs:   The focus of this group is to help patients identify their personal needs that have been historically problematic and identify healthy behaviors to address their needs.  Participation Level:  Active  Participation Quality:  Appropriate  Affect: flat depressed Cognitive:  Oriented  Insight:  Improving  Engagement in Group: answered questions and paid attention, but not fully engaged outwardly  Additional Comments:  Pt was engaged in the group and partisipated  Paulino Rily

## 2015-02-18 NOTE — [facility] Group Notes (Signed)
[place] Group Notes: (Clinical Social Work)   02/18/2015      Type of Therapy:  Group Therapy   Participation Level:  Did Not Attend despite MHT prompting   Selmer Dominion, LCSW 02/18/2015, 2:20 PM

## 2015-02-18 NOTE — Progress Notes (Signed)
Adult Psychoeducational Group Note  Date:  02/17/2015 Time:  2000  Group Topic/Focus:  Wrap-Up Group:   The focus of this group is to help patients review their daily goal of treatment and discuss progress on daily workbooks.  Participation Level:  Did Not Attend  Additional Comments:Pt did not attend group due to wanting to just rest.  Evo Aderman H 02/18/2015, 3:34 AM

## 2015-02-18 NOTE — Progress Notes (Signed)
Psychoeducational Group Note  Date: 02/18/2015 Time: 1015  Group Topic/Focus:  Making Healthy Choices:   The focus of this group is to help patients identify negative/unhealthy choices they were using prior to admission and identify positive/healthier coping strategies to replace them upon discharge.  Participation Level:  Attentive  Participation Quality:  fair  Affect: Flat   Cognitive:  Intact  Insight:  Unknown  Engagement in Group:    Additional Comments:    02/18/2015,11:02 AM Kelvin Burpee, Trixie Rude

## 2015-02-18 NOTE — Progress Notes (Signed)
D: Patient resting in bed on her back with eyes closed.  Respirations even and unlabored.  Patient appears to be in no apparent distress. A: Staff to monitor Q 15 mins for safety.  Patient remains on 1:1 for fall risk safety. R:Patient remains safe on the unit.

## 2015-02-18 NOTE — Progress Notes (Signed)
1:1 Note D) Pt remains on her 1:1. Affect remains flat and mood depressed. Rates her depression, hopelessness and anxiety at a 9. Writer that she sometimes feels suicidal. When asked if she would ever act on it, Pt stated "there is nothing to do it with here".  A) Given support reassurance and praise. Provided with her prn of pain pills. Given support. R) Pt remains safe.

## 2015-02-18 NOTE — Progress Notes (Signed)
1:1 Note D) Pt concerned over her whether or not her car registration was in her car or not. Requested that this writer go down and look for it. States "I am trying to wait longer before I ask for the pain medications".  A) Went to the search room and informed Pt that her car registration was in her purse. Given support and prasie for her ability to wait an extra hour to get her pain medications R) Remains flat, depressed, but remains safe.

## 2015-02-18 NOTE — Progress Notes (Signed)
Patient ID: Jacqueline Moyer, female   DOB: 1956/05/04, 59 y.o.   MRN: [id number] [facility] MD Progress Note  02/18/2015 3:33 PM Gricel Copen  MRN:  [id number]  Subjective:  As yesterday, patient reports  Gradual but significant improvement of her peripheral edema, and decreased pain. She reports, however, ongoing significant depression. States she continues to have passive SI, but denies plan or intention of hurting self .  Denies medication side effects.  Objective: Patient seen and care discussed with treatment team.  Patient remains depressed, sad, easily becoming tearful. Denies hallucinations. Describes ongoing passive suicidal ideations, but denies plan or intent of hurting self.  Participates in groups only with encouragement from staff, and at times isolates in her room.  She does interact with her 1:1  Staff. She describes improved pain, and at this time does not endorse significant pain or discomfort. Ambulation has tended  To improve as her edema improves. She is still walking with a cane, but less painfully. She denies falls. Although significantly depressed and still intermittently tearful , she does seem better able to at least entertain some options to address her depression and social isolation after discharge. Expressed some interest in volunteering  And also expressed some interest in getting a pet dog to feel less lonely and as an incentive to be more mobile and engaged.       Principal Problem: MDD (major depressive disorder), recurrent severe, without psychosis Diagnosis:   Patient Active Problem List   Diagnosis Date Noted  . Swelling of lower extremity [M79.89]   . Drug-induced hepatitis [K75.9] 02/08/2015  . Suicide attempt by acetaminophen overdose [T39.1X2A]   . Overdose [T50.901A] 02/07/2015  . Intentional acetaminophen overdose [T39.1X2A] 02/07/2015  . Cellulitis and abscess of leg [L02.419, L03.119] 02/07/2015  . AKI (acute kidney injury) [N17.9] 02/07/2015  .  Transaminasemia [R74.0] 02/07/2015  . MDD (major depressive disorder), recurrent severe, without psychosis [F33.2] 02/07/2015  . Paresthesias [R20.2] 01/28/2015  . 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 Dysphagia [R13.10] 01/28/2015  . Breast cancer of lower-outer quadrant of left female breast [C50.512] 12/22/2014  . Proximal muscle weakness [M62.89] 12/09/2014  . Protein-calorie malnutrition, severe [E43] 11/21/2014  . Hypokalemia [E87.6] 11/19/2014  . Benign essential HTN [I10] 11/19/2014  . UTI (lower urinary tract infection) [N39.0] 11/19/2014  . Abdominal pain [R10.9] 11/19/2014  . GERD [K21.9] 08/29/2008  . History of malignant neoplasm of large intestine [Z85.038] 08/29/2008   Total Time spent with patient: 20 minutes   Past Medical History:  Past Medical History  Diagnosis Date  . Hypertension   . Thyroid disease     hyperthyroidism  . Chronic back pain   . Chronic neck pain   . Depression   . COPD (chronic obstructive pulmonary disease)   . Anemia   . Cancer approx 2006    colon cancer  . Blood transfusion without reported diagnosis   . GERD (gastroesophageal reflux disease)   . Arthritis   . Anxiety     Past Surgical History  Procedure Laterality Date  . Cholecystectomy    . Tonsillectomy    . Abdominal hysterectomy    . Hernia repair    . Colon surgery  approx. 2006    for colon cancer  . Gastric bypass  2000   Family History:  Family History  Problem Relation Age of Onset  . Colon cancer Father 65  . Prostate cancer Brother    Social History:  History  Alcohol Use No  Comment: denied      History  Drug Use No    Comment: denied     History   Social History  . Marital Status: Divorced    Spouse Name: Leanna Sato   . Number of Children: 1  . Years of Education: 12+   Occupational History  . Disabled     Social History Main Topics  . Smoking status: Former Smoker -- 1.00 packs/day for 40 years     Types: Cigarettes    Quit date: 12/27/2013  . Smokeless tobacco: Current User    Last Attempt to Quit: 04/04/2013  . Alcohol Use: No     Comment: denied   . Drug Use: No     Comment: denied   . Sexual Activity: No   Other Topics Concern  . None   Social History Narrative   Patient lives at home with life partner Leanna Sato    Patient has 1 child    Patient is disabled    Patient has a BS degree      Additional History:    Sleep: Fair  Appetite:  Improving   Assessment:   Musculoskeletal: Strength & Muscle Tone: within normal limits Gait & Station: normal Patient leans: N/A   Psychiatric Specialty Exam: Physical Exam  ROS - no SOB,  No CP, no  cough.  No orthopnea, no PND, (+) Hip and leg pain, slow gait. Depression, no seizures. No rash. No fever, chills.  * of note, states pain is improving .   Blood pressure 96/59, pulse 87, temperature 98.9 F (37.2 C), temperature source Oral, resp. rate 18, height 5' 5.75" (1.67 m), weight 182 lb (82.555 kg).Body mass index is 29.6 kg/(m^2).  General Appearance: Fairly Groomed  Engineer, water::  Good  Speech:  Normal Rate  Volume:  Normal  Mood:  Depressed,   Affect:  Constricted, still tearful at times   Thought Process:  Goal Directed and Linear  Orientation:  Full (Time, Place, and Person)  Thought Content:  No delusions, no hallucinations, not internally preoccupied  Suicidal Thoughts:  No - denies any thoughts of hurting herself at this time   Homicidal Thoughts:  No  Memory:  Recent and remote grossly intact  Judgement:  Fair  Insight:  Fair  Psychomotor Activity:  Normal - gait improving as pain subsides   Concentration:  Good  Recall:  Good  Fund of Knowledge:Good  Language: Good  Akathisia:  Negative  Handed:  Right  AIMS (if indicated):     Assets:  Communication Skills Desire for Improvement Resilience  ADL's:  Impaired  Cognition: WNL  Sleep:  Number of Hours: 6.25     Current  Medications: Current Facility-Administered Medications  Medication Dose Route Frequency Provider Last Rate Last Dose  . anastrozole (ARIMIDEX) tablet 1 mg  1 mg Oral Daily Jenne Campus, MD   1 mg at 02/18/15 0957  . DULoxetine (CYMBALTA) DR capsule 60 mg  60 mg Oral Daily Jenne Campus, MD   60 mg at 02/18/15 0958  . furosemide (LASIX) tablet 40 mg  40 mg Oral Daily Robbie Lis, MD   40 mg at 02/18/15 0277  . gabapentin (NEURONTIN) capsule 100 mg  100 mg Oral TID Jenne Campus, MD   100 mg at 02/18/15 1110  . levothyroxine (SYNTHROID, LEVOTHROID) tablet 75 mcg  75 mcg Oral QAC breakfast Robbie Lis, MD   75 mcg at 02/18/15 [phone]  . multivitamin with minerals tablet  1 tablet  1 tablet Oral Daily Clayton Bibles, RD   1 tablet at 02/18/15 [phone]  . nicotine (NICODERM CQ - dosed in mg/24 hours) patch 21 mg  21 mg Transdermal Q0600 Niel Hummer, NP   21 mg at 02/18/15 0957  . oxyCODONE (Oxy IR/ROXICODONE) immediate release tablet 20 mg  20 mg Oral Q6H PRN Jenne Campus, MD   20 mg at 02/18/15 1110  . pantoprazole (PROTONIX) EC tablet 40 mg  40 mg Oral Daily Jenne Campus, MD   40 mg at 02/18/15 0957  . promethazine (PHENERGAN) tablet 25 mg  25 mg Oral Q6H PRN Kerrie Buffalo, NP   25 mg at 02/18/15 6144  . traZODone (DESYREL) tablet 25 mg  25 mg Oral QHS PRN Jenne Campus, MD   25 mg at 02/17/15 2250    Lab Results:  No results found for this or any previous visit (from the past 48 hour(s)).  Physical Findings: AIMS: Facial and Oral Movements Muscles of Facial Expression: None, normal Lips and Perioral Area: None, normal Jaw: None, normal Tongue: None, normal,Extremity Movements Upper (arms, wrists, hands, fingers): None, normal Lower (legs, knees, ankles, toes): None, normal, Trunk Movements Neck, shoulders, hips: None, normal, Overall Severity Severity of abnormal movements (highest score from questions above): None, normal Incapacitation due to abnormal movements: None,  normal Patient's awareness of abnormal movements (rate only patient's report): No Awareness, Dental Status Current problems with teeth and/or dentures?: No Does patient usually wear dentures?: No  CIWA:  CIWA-Ar Total: 2 COWS:  COWS Total Score: 2  Asssessment:   Patient remains depressed, constricted, intermittently tearful, with passive SI. No psychotic symtoms. Peripheral edema still significant but improved compared to admission, resulting in decreased pain and improving ambulation. Tolerating medications well .     Treatment Plan Summary: Daily contact with patient to assess and evaluate symptoms and progress in treatment, Medication management, Plan continue inpatient treatment  and medications as below    Continue  Cymbalta to  60  mg QDAY- consider further titration to 60 mgrs BID if she tolerates this dose well. Continue Levothyroxine 75 mcg QDAY  Continue Arimidex 1 mg QDAY Continue  Oxycodone to 20 mg Q6 hours for pain  Increase  Neurontin to  100 mgrs TID Will order repeat BMP to follow K+ level , as currently on lasix .    Medical Decision Making:  Established Problem, Stable/Improving (1), Review of Psycho-Social Stressors (1), Review or order clinical lab tests (1), Review of Medication Regimen & Side Effects (2) and Review of New Medication or Change in Dosage (2)     COBOS, FERNANDO 02/18/2015, 3:33 PM

## 2015-02-18 NOTE — Progress Notes (Signed)
D: Patient resting in bed with eyes closed.  Respirations even and unlabored.  Patient appears to be in no apparent distress. A: Staff to monitor Q 15 mins for safety.  Patient remains on 1:1 for safety. R:  Patient remains safe on the unit.  

## 2015-02-18 NOTE — Progress Notes (Addendum)
!:!   NOTE D) Pt has been sitting in the dayroom. Affect remains flat and mood depressed. Was up at the nurses station at one point and smiled a broad smile. When it was mentioned "That's a lovely smile" Pt immediately reverted back to her poor eye contact. States she is feeling safer and stronger A) Pt remains on her 1:1. Given support and reassurance along with praise. R) Denies SI and HI

## 2015-02-18 NOTE — Progress Notes (Signed)
D:Patient in her room tonight awake.  Patient states she had a decent day.  Patient states  she is still worried about her car and her living situation when discharged.  Patient states she has been worrying a lot.  Patient does states she has been able to get around better since the swelling has decreased on her lower extremities.  Patient still rates anxiety and depression   Patient denies SI/HI and denies AVH. A: Staff to monitor Q 15 mins for safety.  Encouragement and support offered.  No  scheduled medications administered per orders. R: Patient remains safe on the unit.  Patient did not attend group tonight.  Patient not visible on the unit tonight.  Patient taking administered medications.

## 2015-02-19 LAB — BASIC METABOLIC PANEL
Anion gap: 7 (ref 5–15)
BUN: 9 mg/dL (ref 6–23)
CHLORIDE: 103 mmol/L (ref 96–112)
CO2: 27 mmol/L (ref 19–32)
Calcium: 7.6 mg/dL — ABNORMAL LOW (ref 8.4–10.5)
Creatinine, Ser: 0.53 mg/dL (ref 0.50–1.10)
GFR calc Af Amer: >90 mL/min (ref >90)
GFR calc non Af Amer: >90 mL/min (ref >90)
GLUCOSE: 78 mg/dL (ref 70–99)
Potassium: 2.9 mmol/L — ABNORMAL LOW (ref 3.5–5.1)
Sodium: 137 mmol/L (ref 135–145)

## 2015-02-19 MED ORDER — POTASSIUM CHLORIDE CRYS ER 20 MEQ PO TBCR
20.0000 meq | EXTENDED_RELEASE_TABLET | Freq: Every day | ORAL | Status: DC
  Administered 2015-02-19: 20 meq via ORAL
  Filled 2015-02-19 (×4): qty 1

## 2015-02-19 MED ORDER — DULOXETINE HCL 30 MG PO CPEP
30.0000 mg | ORAL_CAPSULE | Freq: Every day | ORAL | Status: DC
  Administered 2015-02-19 – 2015-02-22 (×4): 30 mg via ORAL
  Filled 2015-02-19 (×7): qty 1

## 2015-02-19 MED ORDER — MIRTAZAPINE 7.5 MG PO TABS
7.5000 mg | ORAL_TABLET | Freq: Every day | ORAL | Status: DC
  Administered 2015-02-19: 7.5 mg via ORAL
  Filled 2015-02-19 (×4): qty 1

## 2015-02-19 MED ORDER — FUROSEMIDE 20 MG PO TABS
10.0000 mg | ORAL_TABLET | Freq: Every day | ORAL | Status: DC | PRN
  Administered 2015-02-20: 10 mg via ORAL
  Filled 2015-02-19: qty 1
  Filled 2015-02-19: qty 3

## 2015-02-19 MED ORDER — POTASSIUM CHLORIDE CRYS ER 20 MEQ PO TBCR
40.0000 meq | EXTENDED_RELEASE_TABLET | Freq: Three times a day (TID) | ORAL | Status: AC
  Administered 2015-02-19: 40 meq via ORAL
  Administered 2015-02-20: 20 meq via ORAL
  Administered 2015-02-20 (×2): 40 meq via ORAL
  Filled 2015-02-19 (×7): qty 2

## 2015-02-19 NOTE — [facility] Group Notes (Signed)
[place] LCSW Group Therapy 02/19/2015  1:15 PM   Type of Therapy: Group Therapy  Participation Level: Did Not Attend. Patient invited to participate but declined.   Tilden Fossa, MSW, [place] Worker [facility] [phone]

## 2015-02-19 NOTE — Progress Notes (Addendum)
NP [place] made aware of potassium level of 2.9 at 0938 hrs when this writer looked up value at 0935 hrs. No orders receive but NP stated " I will look into it". Also made aware of calcium level of 7.6

## 2015-02-19 NOTE — Progress Notes (Signed)
D:Patient in the dayroom sitting up watching television on approach today.  Patient appeared brighter and states she felt a little better today.  Patient states she continues to worry about her car.  Patient did states that today she realized that maybe her life is not that bad after listening to her peers.  Patient states she has had some passive SI today but verbally contracts for safety.  Patient denies HI/AVH. A: Staff to monitor Q 15 mins for safety.  Encouragement and support offered.  Scheduled medications administered per orders.  Oxycodone administered prn for for pain. R: Patient remains safe on the unit.  Patient attended group tonight.  Patient visible on the unit and minimal to no interaction with peers.  Patient taking administered medications.

## 2015-02-19 NOTE — Progress Notes (Signed)
RN 1:1 NOTE  D: Patient reports nausea off and on; patient reports that she is frustrated with feeling like this; support offered but refused at this time  A: Monitored q 15 minutes; patient encouraged to attend groups; patient educated about medications; patient given medications per physician orders; patient encouraged to express feelings and/or concerns  R: Patient is flat and sad; patient is cooperative; patient is minimal; patient looks pained; patient is assertive; patient has no other acute needs at this time

## 2015-02-19 NOTE — Clinical Social Work Note (Signed)
At patient's request, CSW contacted ex-boyfriend Leanna Sato [phone] to inquire if he can assist her with getting her car out of the impound lot. John reports that he does not have the funds to get her car out of the impound lot but will pick it up for her on Friday when she gets paid if she can write him a check and that he would drop it off at the hospital for her along with her belongings. John also reported that patient has a history of abusing her pain medications.   Patient also requested that [place] contact her daughter Lars Mage [phone] regarding transferring patient money but no answer. CSW left voicemail, awaiting return call.  Tilden Fossa, MSW, [place] Worker [facility] [phone]

## 2015-02-19 NOTE — Progress Notes (Signed)
[facility] Group Notes:  (Nursing/MHT/Case Management/Adjunct)  Date:  02/19/2015  Time:  11:51 PM  Type of Therapy:  Group Therapy  Participation Level:  Minimal  Participation Quality:  Appropriate  Affect:  Appropriate  Cognitive:  Appropriate  Insight:  Appropriate  Engagement in Group:  Improving  Modes of Intervention:  Socialization and Support  Summary of Progress/Problems: Pt. Was engaged in group discussion.  Pt. Stated she cooks when she is feeling well about self.   Lanell Persons 02/19/2015, 11:51 PM

## 2015-02-19 NOTE — Clinical Social Work Note (Signed)
At patient request, CSW spoke with patient's ex-boyfriend John to request that he drop off patient's check book and debit card. Ex-boyfriend reports that he does not have her debit card or know where it is. He reports that he has a few of her checks and will drop them off at reception this afternoon. He also states that he has her belongings packed up and will keep them until she can have someone pick them up.   Tilden Fossa, MSW, [place] Worker [facility] [phone]

## 2015-02-19 NOTE — Progress Notes (Signed)
D: Patient denies SI/HI and A/V hallucinations  A: Monitored q 15 minutes; patient encouraged to attend groups; patient educated about medications; patient given medications per physician orders; patient encouraged to express feelings and/or concerns  R: Patient is cooperative; patient has no acute needs at this time

## 2015-02-19 NOTE — Progress Notes (Signed)
Patient 1:1 Note Patient in the dayrrom sitting quietly.  Patient appears calm but still depressed.  Patient does brighten when speaking to Probation officer.  Patient remains on 1:1 for fall risk safety.

## 2015-02-19 NOTE — Clinical Social Work Note (Signed)
Patient's ex-boyfriend John dropped of patient's checkbook with checks [phone], placed in patient's locker.  Tilden Fossa, MSW, [place] Worker [facility] [phone]

## 2015-02-19 NOTE — Progress Notes (Signed)
Patient ID: Jacqueline Moyer, female   DOB: 1956/09/14, 59 y.o.   MRN: [id number] [facility] MD Progress Note  02/19/2015 1:11 PM Lala Been  MRN:  [id number]  Subjective: Patient continues to feel very depressed. She states , however, that she may be a little less hopeless than she was feeling before. She denies medication side effects. She states she feels she has very little support, after termination of relationship with boyfriend, and states her daughter has been less helpful than she was hoping she would be. States she had sent her daughter about 800 dollars but daughter stated she only had gotten 100, which has been upsetting her. Does state that her BF brought her checkbook, which had been something she had been worried about .   Objective: Patient seen and care discussed with treatment team.  As discussed with staff, patient remains depressed, sad, and isolative. She has made some efforts, and states her daily goals include being more sociable and interacting more. She presents depressed, sad, blunted /flat in affect. Denies any active SI ( does  Endorse  ongoing passive thoughts about being better off dead sometimes ) and contracts for safety on the unit.  Has not exhibited any self injurious behaviors on unit. Peripheral edema improved, and as it improves her ambulation has become less painful. However, as discussed with Nursing staff, she is still considered to be a high risk of falling , gait still unsteady at times, due to which she is continues to be on a one to one observation status. She denies medication side effects. (K+ low at 2.9)          Principal Problem: MDD (major depressive disorder), recurrent severe, without psychosis Diagnosis:   Patient Active Problem List   Diagnosis Date Noted  . Swelling of lower extremity [M79.89]   . Drug-induced hepatitis [K75.9] 02/08/2015  . Suicide attempt by acetaminophen overdose [T39.1X2A]   . Overdose [T50.901A] 02/07/2015  .  Intentional acetaminophen overdose [T39.1X2A] 02/07/2015  . Cellulitis and abscess of leg [L02.419, L03.119] 02/07/2015  . AKI (acute kidney injury) [N17.9] 02/07/2015  . Transaminasemia [R74.0] 02/07/2015  . MDD (major depressive disorder), recurrent severe, without psychosis [F33.2] 02/07/2015  . Paresthesias [R20.2] 01/28/2015  . 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 Dysphagia [R13.10] 01/28/2015  . Breast cancer of lower-outer quadrant of left female breast [C50.512] 12/22/2014  . Proximal muscle weakness [M62.89] 12/09/2014  . Protein-calorie malnutrition, severe [E43] 11/21/2014  . Hypokalemia [E87.6] 11/19/2014  . Benign essential HTN [I10] 11/19/2014  . UTI (lower urinary tract infection) [N39.0] 11/19/2014  . Abdominal pain [R10.9] 11/19/2014  . GERD [K21.9] 08/29/2008  . History of malignant neoplasm of large intestine [Z85.038] 08/29/2008   Total Time spent with patient: 20 minutes   Past Medical History:  Past Medical History  Diagnosis Date  . Hypertension   . Thyroid disease     hyperthyroidism  . Chronic back pain   . Chronic neck pain   . Depression   . COPD (chronic obstructive pulmonary disease)   . Anemia   . Cancer approx 2006    colon cancer  . Blood transfusion without reported diagnosis   . GERD (gastroesophageal reflux disease)   . Arthritis   . Anxiety     Past Surgical History  Procedure Laterality Date  . Cholecystectomy    . Tonsillectomy    . Abdominal hysterectomy    . Hernia repair    . Colon surgery  approx. 2006  for colon cancer  . Gastric bypass  2000   Family History:  Family History  Problem Relation Age of Onset  . Colon cancer Father 49  . Prostate cancer Brother    Social History:  History  Alcohol Use No    Comment: denied      History  Drug Use No    Comment: denied     History   Social History  . Marital Status: Divorced    Spouse Name: Leanna Sato   . Number of  Children: 1  . Years of Education: 12+   Occupational History  . Disabled     Social History Main Topics  . Smoking status: Former Smoker -- 1.00 packs/day for 40 years    Types: Cigarettes    Quit date: 12/27/2013  . Smokeless tobacco: Current User    Last Attempt to Quit: 04/04/2013  . Alcohol Use: No     Comment: denied   . Drug Use: No     Comment: denied   . Sexual Activity: No   Other Topics Concern  . None   Social History Narrative   Patient lives at home with life partner Leanna Sato    Patient has 1 child    Patient is disabled    Patient has a BS degree      Additional History:    Sleep: improved   Appetite:  Improving   Assessment:   Musculoskeletal: Strength & Muscle Tone: within normal limits Gait & Station: normal Patient leans: N/A   Psychiatric Specialty Exam: Physical Exam  ROS - no SOB,  No CP, no  cough.  No orthopnea, no PND, (+) Hip and leg pain, slow gait. Depression, no seizures. No rash. No fever, chills.  * of note, states pain is improving .   Blood pressure 111/57, pulse 79, temperature 98.9 F (37.2 C), temperature source Oral, resp. rate 18, height 5' 5.75" (1.67 m), weight 182 lb (82.555 kg).Body mass index is 29.6 kg/(m^2).  General Appearance: Fairly Groomed  Engineer, water::  Good  Speech:  Normal Rate  Volume:  Normal  Mood:  Depressed,   Affect:  Constricted,  Flat   Thought Process:  Goal Directed and Linear  Orientation:  Full (Time, Place, and Person)  Thought Content:  No delusions, no hallucinations, not internally preoccupied  Suicidal Thoughts:  No - denies any thoughts of hurting herself at this time  (+) ongoing  passive SI, but no plan or intention of hurting self   Homicidal Thoughts:  No  Memory:  Recent and remote grossly intact  Judgement:  Fair  Insight:  Fair  Psychomotor Activity:  Still decreased  - gait improving as pain subsides   Concentration:  Good  Recall:  Good  Fund of Knowledge:Good   Language: Good  Akathisia:  Negative  Handed:  Right  AIMS (if indicated):     Assets:  Communication Skills Desire for Improvement Resilience  ADL's:  Impaired  Cognition: WNL  Sleep:  Number of Hours: 6.25     Current Medications: Current Facility-Administered Medications  Medication Dose Route Frequency Provider Last Rate Last Dose  . anastrozole (ARIMIDEX) tablet 1 mg  1 mg Oral Daily Jenne Campus, MD   1 mg at 02/19/15 0839  . DULoxetine (CYMBALTA) DR capsule 30 mg  30 mg Oral QHS Fernando A Cobos, MD      . DULoxetine (CYMBALTA) DR capsule 60 mg  60 mg Oral Daily Jenne Campus, MD  60 mg at 02/19/15 0839  . furosemide (LASIX) tablet 40 mg  40 mg Oral Daily Robbie Lis, MD   40 mg at 02/19/15 0839  . gabapentin (NEURONTIN) capsule 100 mg  100 mg Oral TID Jenne Campus, MD   100 mg at 02/19/15 1213  . levothyroxine (SYNTHROID, LEVOTHROID) tablet 75 mcg  75 mcg Oral QAC breakfast Robbie Lis, MD   75 mcg at 02/19/15 [phone]  . mirtazapine (REMERON) tablet 7.5 mg  7.5 mg Oral QHS Myer Peer Cobos, MD      . multivitamin with minerals tablet 1 tablet  1 tablet Oral Daily Clayton Bibles, RD   1 tablet at 02/19/15 [phone]  . nicotine (NICODERM CQ - dosed in mg/24 hours) patch 21 mg  21 mg Transdermal Q0600 Niel Hummer, NP   21 mg at 02/19/15 0841  . oxyCODONE (Oxy IR/ROXICODONE) immediate release tablet 20 mg  20 mg Oral Q6H PRN Jenne Campus, MD   20 mg at 02/19/15 0839  . pantoprazole (PROTONIX) EC tablet 40 mg  40 mg Oral Daily Jenne Campus, MD   40 mg at 02/19/15 0839  . potassium chloride SA (K-DUR,KLOR-CON) CR tablet 20 mEq  20 mEq Oral Daily Encarnacion Slates, NP   20 mEq at 02/19/15 1213  . promethazine (PHENERGAN) tablet 25 mg  25 mg Oral Q6H PRN Kerrie Buffalo, NP   25 mg at 02/19/15 1051    Lab Results:  Results for orders placed or performed during the hospital encounter of 02/12/15 (from the past 48 hour(s))  Basic metabolic panel     Status: Abnormal    Collection Time: 02/19/15  6:20 AM  Result Value Ref Range   Sodium 137 135 - 145 mmol/L   Potassium 2.9 (L) 3.5 - 5.1 mmol/L   Chloride 103 96 - 112 mmol/L   CO2 27 19 - 32 mmol/L   Glucose, Bld 78 70 - 99 mg/dL   BUN 9 6 - 23 mg/dL   Creatinine, Ser 0.53 0.50 - 1.10 mg/dL   Calcium 7.6 (L) 8.4 - 10.5 mg/dL   GFR calc non Af Amer >90 >90 mL/min   GFR calc Af Amer >90 >90 mL/min    Comment: (NOTE) The eGFR has been calculated using the CKD EPI equation. This calculation has not been validated in all clinical situations. eGFR's persistently <90 mL/min signify possible Chronic Kidney Disease.    Anion gap 7 5 - 15    Comment: Performed at [facility]    Physical Findings: AIMS: Facial and Oral Movements Muscles of Facial Expression: None, normal Lips and Perioral Area: None, normal Jaw: None, normal Tongue: None, normal,Extremity Movements Upper (arms, wrists, hands, fingers): None, normal Lower (legs, knees, ankles, toes): None, normal, Trunk Movements Neck, shoulders, hips: None, normal, Overall Severity Severity of abnormal movements (highest score from questions above): None, normal Incapacitation due to abnormal movements: None, normal Patient's awareness of abnormal movements (rate only patient's report): No Awareness, Dental Status Current problems with teeth and/or dentures?: No Does patient usually wear dentures?: No  CIWA:  CIWA-Ar Total: 2 COWS:  COWS Total Score: 2  Asssessment:   Patient remains severely  depressed, constricted and flat in affect, ruminative about stressors, describing multiple significant psychosocial stressors, to include limited funds, minimal local support, chronic pain, housing challenges. She is tolerating Cymbalta well, but improvement has been modest thus far. No psychotic symptoms.  Edema subsiding with Lasix, but currently hypokalemic.  Treatment Plan Summary: Daily contact with patient to assess and evaluate symptoms  and progress in treatment, Medication management, Plan continue inpatient treatment  and medications as below    Increase Cymbalta to  60 mgrs QAM and 30 mgrs QHS Agrees to augmentation with Remeron, will start at 7.5 mgrs QHS  Will manage/correct  hypokalemia Continue Levothyroxine 75 mcg QDAY  Continue Arimidex 1 mg QDAY Continue  Oxycodone to 20 mg Q6 hours for pain  Continue Neurontin to  100 mgrs TID    Medical Decision Making:  Established Problem, Stable/Improving (1), Review of Psycho-Social Stressors (1), Review or order clinical lab tests (1), Review of Medication Regimen & Side Effects (2) and Review of New Medication or Change in Dosage (2)     COBOS, FERNANDO 02/19/2015, 1:11 PM

## 2015-02-19 NOTE — Progress Notes (Signed)
Nursing 1:1 note D: Patient resting in bed with eyes closed.  Respirations even and unlabored.  Patient appears to be in no apparent distress. A: Staff to monitor Q 15 mins for safety.  Patient remains on 1:1 for fall risk safety. R:Patient remains safe on the unit.

## 2015-02-19 NOTE — Progress Notes (Signed)
Recreation Therapy Notes  Date: 03.28.2016 Time: 9:30am Location: [address] Group Room   Group Topic: Stress Management  Goal Area(s) Addresses:  Patient will actively participate in stress management techniques presented during session.   Behavioral Response: Did not attend.   Laureen Ochs Nona Gracey, LRT/CTRS  Reika Callanan L 02/19/2015 10:20 AM

## 2015-02-19 NOTE — Progress Notes (Signed)
RN 1:1 NOTE  D: Patient denies SI/HI and A/V hallucinations; patient reports sleep is fair; reports appetite is poor; reports energy level is low ; reports ability to concentrate is poor; rates depression as 9/10; rates hopelessness 9/10; rates anxiety as 9/10; patient complains of the pain and the nausea   A: Monitored q 15 minutes; patient encouraged to attend groups; patient educated about medications; patient given medications per physician orders and patient also given medication to help with the pain and nausea; patient encouraged to express feelings and/or concerns  R: Patient is cooperative; patient ambulation is still unsteady and she refuses to use the gait belt; patient uses cane; patient is flat, sad, and depressed; patient speech is soft and low; patient forwards little information and can be tearful; patient attended the morning groups

## 2015-02-19 NOTE — Progress Notes (Signed)
D: Patient resting in bed with eyes closed.  Respirations even and unlabored.  Patient appears to be in no apparent distress. A: Staff to monitor Q 15 mins for safety.  Patient remains on 1:1 for safety. R:  Patient remains safe on the unit.  

## 2015-02-19 NOTE — [facility] Group Notes (Signed)
   [facility] LCSW Aftercare Discharge Planning Group Note  02/19/2015  8:45 AM   Participation Quality: Alert, Appropriate and Oriented  Mood/Affect: Depressed and Flat, tearful  Depression Rating: 9  Anxiety Rating: 9  Thoughts of Suicide: Pt endorses SI but contracts for safety  Will you contract for safety? Yes  Current AVH: Pt denies  Plan for Discharge/Comments: Pt attended discharge planning group and actively participated in group. CSW provided pt with today's workbook. Patient reports feeling unwell today and experiencing high levels of depression and anxiety. Patient was tearful in group, CSW agreed to meet with patient individually later today.  Transportation Means: Pt reports access to transportation  Supports: No supports mentioned at this time  Tilden Fossa, MSW, [place] Social Worker [employer] [phone]

## 2015-02-20 LAB — BASIC METABOLIC PANEL
Anion gap: 6 (ref 5–15)
BUN: 9 mg/dL (ref 6–23)
CHLORIDE: 104 mmol/L (ref 96–112)
CO2: 29 mmol/L (ref 19–32)
CREATININE: 0.64 mg/dL (ref 0.50–1.10)
Calcium: 8.2 mg/dL — ABNORMAL LOW (ref 8.4–10.5)
GFR calc Af Amer: >90 mL/min (ref >90)
GFR calc non Af Amer: >90 mL/min (ref >90)
Glucose, Bld: 86 mg/dL (ref 70–99)
Potassium: 3.4 mmol/L — ABNORMAL LOW (ref 3.5–5.1)
Sodium: 139 mmol/L (ref 135–145)

## 2015-02-20 LAB — MAGNESIUM: MAGNESIUM: 1.6 mg/dL (ref 1.5–2.5)

## 2015-02-20 MED ORDER — MIRTAZAPINE 15 MG PO TABS
15.0000 mg | ORAL_TABLET | Freq: Every day | ORAL | Status: DC
  Administered 2015-02-20 – 2015-02-22 (×3): 15 mg via ORAL
  Filled 2015-02-20 (×2): qty 1
  Filled 2015-02-20: qty 10
  Filled 2015-02-20 (×4): qty 1

## 2015-02-20 MED ORDER — GABAPENTIN 100 MG PO CAPS
200.0000 mg | ORAL_CAPSULE | Freq: Two times a day (BID) | ORAL | Status: DC
  Administered 2015-02-21 – 2015-02-23 (×5): 200 mg via ORAL
  Filled 2015-02-20 (×2): qty 2
  Filled 2015-02-20 (×2): qty 40
  Filled 2015-02-20 (×5): qty 2

## 2015-02-20 NOTE — [facility] Group Notes (Signed)
[facility] Group Notes:  (Nursing/MHT/Case Management/Adjunct)  Date:  02/20/2015  Time:  0930  Type of Therapy:  Nurse Education  Participation Level:  Active  Participation Quality:  Appropriate  Affect:  Appropriate  Cognitive:  Appropriate  Insight:  Appropriate  Engagement in Group:  Engaged  Modes of Intervention:  Discussion and Education  Summary of Progress/Problems:  Marissa Calamity 02/20/2015, 10:14 AM

## 2015-02-20 NOTE — Progress Notes (Signed)
Pt attended spiritual care group on grief and loss facilitated by chaplain Jerene Pitch and counseling intern [place] Nichoel Digiulio. Group opened with brief discussion and psycho-social ed around grief and loss in relationships and in relation to self - identifying life patterns, circumstances, changes that cause losses. Established group norm of speaking from own life experience. Group goal of establishing open and affirming space for members to share loss and experience with grief, normalize grief experience and provide psycho social education and grief support.  Group drew on narrative and Alderian therapeutic modalities.   Jacqueline Moyer was present throughout group but did not contribute to group discussion.   [place] Mozell Hardacre Counseling Intern

## 2015-02-20 NOTE — Progress Notes (Signed)
Patient ID: Jacqueline Moyer, female   DOB: 09-06-1956, 59 y.o.   MRN: [id number] [facility] MD Progress Note  02/20/2015 4:20 PM Jacqueline Moyer  MRN:  [id number]  Subjective: Patient states she is feeling better. She states she is less severely depressed and states she " slept better last night than I have in a long time". She states she is feeling less hopeless . Denies current medication side effects.  Objective: Patient seen and care discussed with treatment team.  As  Noted, patient describes improvement of mood and describes improved sleep and feeling less hopeless. She continues to present , however, with a flat , constricted affect, and a sad countenance in general. She does smile briefly at times. She is more future oriented and states she has found out that her ex boyfriend will be willing to go pick up her car for her, so she will have transportation soon, and states she has a room she has rented where she can stay after discharge. She denies suicidal ideations at present and is more future oriented, stating that after discharge she has to focus on her medical issues, get her elective breast surgery ( lumpectomy for recently diagnosed breast cancer) scheduled, before she can move to [place] which she intends to do in the coming months. Some  Group participation, but tends to be passive during groups as per staff. She is not presenting with any overtly irritable or disruptive behaviors. States her chronic pain, particularly lower extremity pain, is better now that her peripheral edema is subsiding more rapidly. As noted, states current medication regimen is helpful, and slept better  With  Remeron addition to regimen.  K+ improved to 3.4          Principal Problem: MDD (major depressive disorder), recurrent severe, without psychosis Diagnosis:   Patient Active Problem List   Diagnosis Date Noted  . Swelling of lower extremity [M79.89]   . Drug-induced hepatitis [K75.9] 02/08/2015  .  Suicide attempt by acetaminophen overdose [T39.1X2A]   . Overdose [T50.901A] 02/07/2015  . Intentional acetaminophen overdose [T39.1X2A] 02/07/2015  . Cellulitis and abscess of leg [L02.419, L03.119] 02/07/2015  . AKI (acute kidney injury) [N17.9] 02/07/2015  . Transaminasemia [R74.0] 02/07/2015  . MDD (major depressive disorder), recurrent severe, without psychosis [F33.2] 02/07/2015  . Paresthesias [R20.2] 01/28/2015  . 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 Dysphagia [R13.10] 01/28/2015  . Breast cancer of lower-outer quadrant of left female breast [C50.512] 12/22/2014  . Proximal muscle weakness [M62.89] 12/09/2014  . Protein-calorie malnutrition, severe [E43] 11/21/2014  . Hypokalemia [E87.6] 11/19/2014  . Benign essential HTN [I10] 11/19/2014  . UTI (lower urinary tract infection) [N39.0] 11/19/2014  . Abdominal pain [R10.9] 11/19/2014  . GERD [K21.9] 08/29/2008  . History of malignant neoplasm of large intestine [Z85.038] 08/29/2008   Total Time spent with patient: 20 minutes   Past Medical History:  Past Medical History  Diagnosis Date  . Hypertension   . Thyroid disease     hyperthyroidism  . Chronic back pain   . Chronic neck pain   . Depression   . COPD (chronic obstructive pulmonary disease)   . Anemia   . Cancer approx 2006    colon cancer  . Blood transfusion without reported diagnosis   . GERD (gastroesophageal reflux disease)   . Arthritis   . Anxiety     Past Surgical History  Procedure Laterality Date  . Cholecystectomy    . Tonsillectomy    . Abdominal hysterectomy    .  Hernia repair    . Colon surgery  approx. 2006    for colon cancer  . Gastric bypass  2000   Family History:  Family History  Problem Relation Age of Onset  . Colon cancer Father 31  . Prostate cancer Brother    Social History:  History  Alcohol Use No    Comment: denied      History  Drug Use No    Comment: denied     History    Social History  . Marital Status: Divorced    Spouse Name: Leanna Sato   . Number of Children: 1  . Years of Education: 12+   Occupational History  . Disabled     Social History Main Topics  . Smoking status: Former Smoker -- 1.00 packs/day for 40 years    Types: Cigarettes    Quit date: 12/27/2013  . Smokeless tobacco: Current User    Last Attempt to Quit: 04/04/2013  . Alcohol Use: No     Comment: denied   . Drug Use: No     Comment: denied   . Sexual Activity: No   Other Topics Concern  . None   Social History Narrative   Patient lives at home with life partner Leanna Sato    Patient has 1 child    Patient is disabled    Patient has a BS degree      Additional History:    Sleep: improved   Appetite:  Improving   Assessment:   Musculoskeletal: Strength & Muscle Tone: within normal limits Gait & Station: normal Patient leans: N/A   Psychiatric Specialty Exam: Physical Exam  ROS - no SOB,  No CP, no  cough.  No orthopnea, no PND, (+) Hip and leg pain, slow gait. Depression, no seizures. No rash. No fever, chills.  * of note, states pain is improving .   Blood pressure 114/67, pulse 93, temperature 99.3 F (37.4 C), temperature source Oral, resp. rate 16, height 5' 5.75" (1.67 m), weight 182 lb (82.555 kg).Body mass index is 29.6 kg/(m^2).  General Appearance: Fairly Groomed  Engineer, water::  Good  Speech:  Normal Rate  Volume:  Normal  Mood:  States she is feeling better, but mood still appears depressed and affect constricted   Affect:  Constricted,  Flat   Thought Process:  Goal Directed and Linear  Orientation:  Full (Time, Place, and Person)  Thought Content:  No delusions, no hallucinations, not internally preoccupied  Suicidal Thoughts:  No - denies any thoughts of hurting herself at this time/ denies any passive SI as well   Homicidal Thoughts:  No  Memory:  Recent and remote grossly intact  Judgement:  Fair  Insight:  Fair  Psychomotor  Activity:  Still decreased  - gait improving as pain subsides   Concentration:  Good  Recall:  Good  Fund of Knowledge:Good  Language: Good  Akathisia:  Negative  Handed:  Right  AIMS (if indicated):     Assets:  Communication Skills Desire for Improvement Resilience  ADL's:  Impaired  Cognition: WNL  Sleep:  Number of Hours: 6.25     Current Medications: Current Facility-Administered Medications  Medication Dose Route Frequency Provider Last Rate Last Dose  . anastrozole (ARIMIDEX) tablet 1 mg  1 mg Oral Daily Jenne Campus, MD   1 mg at 02/20/15 0851  . DULoxetine (CYMBALTA) DR capsule 30 mg  30 mg Oral QHS Jenne Campus, MD   30 mg  at 02/19/15 2311  . DULoxetine (CYMBALTA) DR capsule 60 mg  60 mg Oral Daily Jenne Campus, MD   60 mg at 02/20/15 0851  . furosemide (LASIX) tablet 10 mg  10 mg Oral Daily PRN Laverle Hobby, PA-C   10 mg at 02/20/15 4401  . gabapentin (NEURONTIN) capsule 100 mg  100 mg Oral TID Jenne Campus, MD   100 mg at 02/20/15 1220  . levothyroxine (SYNTHROID, LEVOTHROID) tablet 75 mcg  75 mcg Oral QAC breakfast Robbie Lis, MD   75 mcg at 02/20/15 [phone]  . mirtazapine (REMERON) tablet 7.5 mg  7.5 mg Oral QHS Myer Peer Cobos, MD   7.5 mg at 02/19/15 2312  . multivitamin with minerals tablet 1 tablet  1 tablet Oral Daily Clayton Bibles, RD   1 tablet at 02/20/15 0851  . nicotine (NICODERM CQ - dosed in mg/24 hours) patch 21 mg  21 mg Transdermal Q0600 Niel Hummer, NP   21 mg at 02/20/15 0848  . oxyCODONE (Oxy IR/ROXICODONE) immediate release tablet 20 mg  20 mg Oral Q6H PRN Jenne Campus, MD   20 mg at 02/20/15 0644  . pantoprazole (PROTONIX) EC tablet 40 mg  40 mg Oral Daily Jenne Campus, MD   40 mg at 02/20/15 0852  . potassium chloride SA (K-DUR,KLOR-CON) CR tablet 40 mEq  40 mEq Oral TID Laverle Hobby, PA-C   20 mEq at 02/20/15 1220  . promethazine (PHENERGAN) tablet 25 mg  25 mg Oral Q6H PRN Kerrie Buffalo, NP   25 mg at 02/20/15 5366     Lab Results:  Results for orders placed or performed during the hospital encounter of 02/12/15 (from the past 48 hour(s))  Basic metabolic panel     Status: Abnormal   Collection Time: 02/19/15  6:20 AM  Result Value Ref Range   Sodium 137 135 - 145 mmol/L   Potassium 2.9 (L) 3.5 - 5.1 mmol/L   Chloride 103 96 - 112 mmol/L   CO2 27 19 - 32 mmol/L   Glucose, Bld 78 70 - 99 mg/dL   BUN 9 6 - 23 mg/dL   Creatinine, Ser 0.53 0.50 - 1.10 mg/dL   Calcium 7.6 (L) 8.4 - 10.5 mg/dL   GFR calc non Af Amer >90 >90 mL/min   GFR calc Af Amer >90 >90 mL/min    Comment: (NOTE) The eGFR has been calculated using the CKD EPI equation. This calculation has not been validated in all clinical situations. eGFR's persistently <90 mL/min signify possible Chronic Kidney Disease.    Anion gap 7 5 - 15    Comment: Performed at [facility] metabolic panel     Status: Abnormal   Collection Time: 02/20/15  6:40 AM  Result Value Ref Range   Sodium 139 135 - 145 mmol/L   Potassium 3.4 (L) 3.5 - 5.1 mmol/L   Chloride 104 96 - 112 mmol/L   CO2 29 19 - 32 mmol/L   Glucose, Bld 86 70 - 99 mg/dL   BUN 9 6 - 23 mg/dL   Creatinine, Ser 0.64 0.50 - 1.10 mg/dL   Calcium 8.2 (L) 8.4 - 10.5 mg/dL   GFR calc non Af Amer >90 >90 mL/min   GFR calc Af Amer >90 >90 mL/min    Comment: (NOTE) The eGFR has been calculated using the CKD EPI equation. This calculation has not been validated in all clinical situations. eGFR's persistently <90 mL/min  signify possible Chronic Kidney Disease.    Anion gap 6 5 - 15    Comment: Performed at [facility]  Magnesium     Status: None   Collection Time: 02/20/15  6:40 AM  Result Value Ref Range   Magnesium 1.6 1.5 - 2.5 mg/dL    Comment: Performed at [facility]    Physical Findings: AIMS: Facial and Oral Movements Muscles of Facial Expression: None, normal Lips and Perioral Area: None, normal Jaw: None,  normal Tongue: None, normal,Extremity Movements Upper (arms, wrists, hands, fingers): None, normal Lower (legs, knees, ankles, toes): None, normal, Trunk Movements Neck, shoulders, hips: None, normal, Overall Severity Severity of abnormal movements (highest score from questions above): None, normal Incapacitation due to abnormal movements: None, normal Patient's awareness of abnormal movements (rate only patient's report): No Awareness, Dental Status Current problems with teeth and/or dentures?: No Does patient usually wear dentures?: No  CIWA:  CIWA-Ar Total: 2 COWS:  COWS Total Score: 2  Asssessment:    Patient is reporting improvement and does seem more future oriented, less hopeless, and better able to work on disposition planning. She states she is sleeping better and is tolerating medication regimen well. Remeron addition seems to be helping. She does, however, continue to present with a significantly sad and constricted affect .  Treatment Plan Summary: Daily contact with patient to assess and evaluate symptoms and progress in treatment, Medication management, Plan continue inpatient treatment  and medications as below    Continue Cymbalta   60 mgrs QAM and 30 mgrs QHS Increase   Remeron to   15  mgrs QHS  Continue Levothyroxine 75 mcg QDAY  Continue Arimidex 1 mg QDAY Continue  Oxycodone to 20 mg Q6 hours for pain  Increase Neurontin to   200 mgrs BID    Medical Decision Making:  Established Problem, Stable/Improving (1), Review of Psycho-Social Stressors (1), Review or order clinical lab tests (1), Review of Medication Regimen & Side Effects (2) and Review of New Medication or Change in Dosage (2)     COBOS, FERNANDO 02/20/2015, 4:20 PM

## 2015-02-20 NOTE — Progress Notes (Signed)
Patient ID: Jacqueline Moyer, female   DOB: 06-02-1956, 59 y.o.   MRN: [id number]   1:1 Note: Pt in bed asleep with eyes closed. Breathing is regular and unlabored. Sitter at bedside. Pt notified of nighttime medications and given time to wake up for dinner. Pt remains on a 1:1 for fall risk safety. Will continue to monitor.

## 2015-02-20 NOTE — Progress Notes (Signed)
[facility] Group Notes:  (Nursing/MHT/Case Management/Adjunct)  Date:  02/20/2015  Time:  11:51 PM  Type of Therapy:  Group Therapy  Participation Level:  Active  Participation Quality:  Appropriate  Affect:  Appropriate  Cognitive:  Appropriate  Insight:  Appropriate  Engagement in Group:  Engaged  Modes of Intervention:  Socialization and Support  Summary of Progress/Problems: Pt. Was engaged in group discussion that focused on recovery.  Pt. Stated her recovery involves taking care or self and keeping away from unhealthy things.  Jacqueline Moyer 02/20/2015, 11:51 PM

## 2015-02-20 NOTE — Progress Notes (Addendum)
Patient ID: Jacqueline Moyer, female   DOB: 1956-11-10, 59 y.o.   MRN: [id number]  1: Note: Pt sitting in the hallway chair. Pt flat affect and cooperative behavior. Pt states "I'm still having pain in my legs but the swelling looks better." Pt legs assessed. Erythema and dryness noted on legs bilaterally. Left leg presents slightly more swollen than the right. Pt states "Maybe I should take my Lasix this morning." Pt given lasix. Sitter remains at side of pt. Pt remains on a 1:1 for fall risk safety. Will continue to monitor.

## 2015-02-20 NOTE — Progress Notes (Signed)
D: Patient resting in bed with eyes closed.  Respirations even and unlabored.  Patient appears to be in no apparent distress. A: Staff to monitor Q 15 mins for safety.  Patient remains on 1:1 for safety. R:  Patient remains safe on the unit.  

## 2015-02-20 NOTE — Progress Notes (Signed)
Patient ID: Jacqueline Moyer, female   DOB: Sep 23, 1956, 59 y.o.   MRN: [id number]  1:1 Note: Pt sitting in dayroom with sitter. Pt in no visible distress. Pt has cane at hand. Sitter within arms reach of the pt. Sitter given a break by Probation officer. Pt remains on a 1:1 for fall risk safety. Will continue to monitor.

## 2015-02-20 NOTE — Progress Notes (Signed)
Patient ID: Jacqueline Moyer, female   DOB: [birth year], 59 y.o.   MRN: [id number] PER STATE REGULATIONS 482.30  THIS CHART WAS REVIEWED FOR MEDICAL NECESSITY WITH RESPECT TO THE PATIENT'S ADMISSION/ DURATION OF STAY.  NEXT REVIEW DATE: 02/24/2015  Chauncy Lean, RN, BSN CASE MANAGER

## 2015-02-20 NOTE — Tx Team (Signed)
Interdisciplinary Treatment Plan Update (Adult) Date: 02/20/2015   Time Reviewed: 9:30 AM  Progress in Treatment: Attending groups: Minimally Participating in groups: Yes Taking medication as prescribed: Yes Tolerating medication: Yes Family/Significant other contact made: Yes, CSW has spoken with patient's daughter and ex-boyfriend Patient understands diagnosis: Yes Discussing patient identified problems/goals with staff: Yes Medical problems stabilized or resolved: Yes Denies suicidal/homicidal ideation: No, patient endorsing passive SI Issues/concerns per patient self-inventory: Yes Other:  New problem(s) identified: N/A  Discharge Plan or Barriers:  3/25: CSW continuing to assess. Patient is currently homeless but states that she may have found housing in [facility]. Patient will follow up with outpatient services at discharge.  3/29: Patient plans to discharge to residence in [facility] and would like a referral for outpatient services. Patient lacks transportation and funds until Friday 02/23/15 and lacks social supports. Patient has medical issues including edema which make ambulation difficult. Patient continues to be on 1:1 observation due to being a high fall risk.   Reason for Continuation of Hospitalization:  Depression Anxiety Medication Stabilization   Comments: N/A  Estimated length of stay: 3-5 days  For review of initial/current patient goals, please see plan of care. Patient is a 59 year old Caucasian female admitted following an intentional overdose and increasing depression. Patient is recently homeless in [place] due to the breakup of her 8 year relationship. Patient will benefit from crisis stabilization, medication evaluation, group therapy, and psycho education in addition to case management for discharge planning. Patient and CSW reviewed pt's identified goals and treatment plan. Pt verbalized understanding and agreed to treatment  plan.   Attendees: Patient:    Family:    Physician: Dr. Parke Poisson 02/20/2015 9:30 AM  Nursing: Christoper Allegra RN 02/20/2015 9:30 AM  Clinical Social Worker: Erasmo Downer Zoya Sprecher, [place] 02/20/2015 9:30 AM  Other: Joette Catching, LCSW 02/20/2015 9:30 AM  Other: Lars Pinks, CM 02/20/2015 9:30 AM  Other: Lucinda Dell, [place] 02/20/2015 9:30 AM  Other: Agustina Caroli, NP 02/20/2015 9:30 AM  Other:                  Scribe for Treatment Team:  Tilden Fossa, MSW, [place] [phone]

## 2015-02-20 NOTE — [facility] Group Notes (Addendum)
[place] LCSW Group Therapy  02/20/2015   1:15 PM   Type of Therapy:  Group Therapy  Participation Level:  Active  Participation Quality:  Attentive, Sharing and Supportive  Affect:  Depressed and Flat  Cognitive:  Alert and Oriented  Insight:  Developing/Improving and Engaged  Engagement in Therapy:  Developing/Improving and Engaged  Modes of Intervention:  Clarification, Confrontation, Discussion, Education, Exploration, Limit-setting, Orientation, Problem-solving, Rapport Building, Art therapist, Socialization and Support  Summary of Progress/Problems: The topic for group therapy was feelings about diagnosis.  Pt actively participated in group discussion on their past and current diagnosis and how they feel towards this.  Pt also identified how society and family members judge them, based on their diagnosis as well as stereotypes and stigmas.   Patient reported feeling "embarrassed" by her hospitalization. Patient engaged in discussion regarding importance of mental health in comparison to physical health, and also discussed society's stigma.  Patient identified her goals as taking care of herself instead of others and finding a support system. CSW and other group members provided emotional support and encouragement.   Tilden Fossa, MSW, [place] Worker [facility] [phone]

## 2015-02-20 NOTE — Progress Notes (Signed)
1:1 note D: Patient resting in bed with eyes closed.  Respirations even and unlabored.  Patient appears to be in no apparent distress. A: Staff to monitor Q 15 mins for safety.  Patient remains on 1:1 for safety. R:Patient remains safe on the unit.

## 2015-02-20 NOTE — Plan of Care (Signed)
Problem: Diagnosis: Increased Risk For Suicide Attempt Goal: LTG-Patient Will Report Improved Mood and Deny Suicidal LTG (by discharge) Patient will report improved mood and deny suicidal ideation.  Outcome: Not Met (add Reason) Patient states she is having passive suicidal thoughts on and off.  Patient verbally contracts for safety.  Patient did states to Probation officer that she is feeling slightly better today.

## 2015-02-20 NOTE — Progress Notes (Signed)
Patient ID: Jacqueline Moyer, female   DOB: 1956-01-13, 59 y.o.   MRN: [id number]  Pt currently presents with a flat affect and depressed behavior. Per self inventory, pt rates depression at a 5, hopelessness 5 and anxiety 5. Pt's daily goal is "geting car" and they intend to do so by "help from SW." Pt reports good sleep, good concentration and a fair appetite. Pt reports nausea throughout the day and also reports pain in her lower extremities.    Pt provided with medications per providers orders. Pt's labs and vitals were monitored throughout the day. Pt supported emotionally and encouraged to express concerns and questions. Pt educated on medications and diet/nutrition. Lower extremities assessed and some swelling noted. MD notified.   Pt's safety ensured with 15 minute and environmental checks. Pt currently denies SI/HI and A/V hallucinations. Pt verbally agrees to seek staff if SI/HI or A/VH occurs and to consult with staff before acting on these thoughts. Pt remains on a 1:1 for fall risk safety.

## 2015-02-21 NOTE — Progress Notes (Signed)
D:  Pt appeared to be in no signs of distress at this time.  A: Writer to alert pt for qhs medications. Writer and staff to [facility] Post 1:1 Observation Documentation  For the first (8) hours following discontinuation of 1:1 precautions, a progress note entry by nursing staff should be documented at least every 2 hours, reflecting the patient's behavior, condition, mood, and conversation.  Use the progress notes for additional entries.  Time 1:1 discontinued:  2120  Patient's Behavior:  Pt in bed resting with eyes closed in supine position.   Patient's Condition:  Pt's respirations even and unlabored.     Patient's Conversation:  Pt is currently in bed sleeping. No conversation at this time.   Alin Hutchins A 02/21/2015, 9:31 PM

## 2015-02-21 NOTE — Progress Notes (Signed)
RN 1:1 NOTE  D: Patient denies SI/HI and A/V hallucinations; patient reports sleep is good; reports appetite is poor; reports energy level is normal ; reports ability to concentrate is good; rates depression as 2/10; rates hopelessness 2/10; rates anxiety as 2/10; patient reporting the pain in her back and hip; patient reports that her goal is to get her discharge plans in order; patient also complains of nausea  A: Monitored q 15 minutes; patient encouraged to attend groups; patient educated about medications; patient given medications per physician orders; patient encouraged to express feelings and/or concerns  R: Patient reports some relief of pain but not these nausea and does not want any other interventions at this time; patient is attending groups; patient is flat, sad, and depressed; patient is minimal in her interaction; patient refused her potassium and reports that it makes her nausea worst; NP Shuvon R. And no orders given at this time

## 2015-02-21 NOTE — Progress Notes (Signed)
[facility] Post 1:1 Observation Documentation  For the first (8) hours following discontinuation of 1:1 precautions, a progress note entry by nursing staff should be documented at least every 2 hours, reflecting the patient's behavior, condition, mood, and conversation.  Use the progress notes for additional entries.  Time 1:1 discontinued: 1329 hrs   Patient's Behavior:  Patient is in the bed resting  Patient's Condition:  Patient respirations are even and unlabored; no acute needs at this time  Patient's Conversation:  There was no conversation at this time because the patient is sleeping.  Marilynne Halsted M 02/21/2015, 7:23 PM

## 2015-02-21 NOTE — Progress Notes (Signed)
Patient 1:1 discontinued at 1320 hrs. Patient is currently in the bed resting with her eyes closed.

## 2015-02-21 NOTE — Progress Notes (Signed)
[facility] Post 1:1 Observation Documentation  For the first (8) hours following discontinuation of 1:1 precautions, a progress note entry by nursing staff should be documented at least every 2 hours, reflecting the patient's behavior, condition, mood, and conversation.  Use the progress notes for additional entries.  Time 1:1 discontinued:  1329  Patient's Behavior:  Patient is in the hallway with cane   Patient's Condition:  Patient is standing and speech soft, low, and slow, and affect is flat  Patient's Conversation:  Patient is asking for pain medication  Marilynne Halsted M 02/21/2015, 3:29 PM

## 2015-02-21 NOTE — Progress Notes (Signed)
[facility] 1:1 Observation Documentation  For the first (8) hours following discontinuation of 1:1 precautions, a progress note entry by nursing staff should be documented at least every 2 hours, reflecting the patient's behavior, condition, mood, and conversation.  Use the progress notes for additional entries.  Time 1:1 discontinued:  1329 hrs  Patient's Behavior:  Patient in line to go to dinner  Patient's Condition:  Patient is interacting with peers  Patient's Conversation:  Patient is talking about dinner  Marilynne Halsted M 02/21/2015, 5:43 PM

## 2015-02-21 NOTE — Progress Notes (Signed)
Patient ID: Jacqueline Moyer, female   DOB: 1956-02-16, 59 y.o.   MRN: [id number] D: client visible on unit, seen in dayroom accompanied by sitter, noted pittiing edema in legs(see Flowsheet). Client reports some nausea. A: Writer provided emotional support, encouraged client to elevate legs when sitting and in bed to help reduce edema. Staff will monitor 1:1 for safety. R: Client is safe on the unit.

## 2015-02-21 NOTE — BHH Group Notes (Signed)
   [facility] LCSW Aftercare Discharge Planning Group Note  02/21/2015  8:45 AM   Participation Quality: Alert, Appropriate and Oriented  Mood/Affect: Depressed and Flat  Depression Rating: 1-2  Anxiety Rating: 1-2  Thoughts of Suicide: Pt denies SI/HI  Will you contract for safety? Yes  Current AVH: Pt denies  Plan for Discharge/Comments: Pt attended discharge planning group and actively participated in group. CSW provided pt with today's workbook. Patient reports feeling "better" today. Patient plans to discharge to a residence in [place] and will follow up with [facility] in [place] [employer].   Transportation Means: CSW continuing to assess  Supports: No supports mentioned at this time  Tilden Fossa, MSW, [place] Social Worker [employer] [phone]

## 2015-02-21 NOTE — BHH Group Notes (Signed)
[place] LCSW Group Therapy 02/21/2015  1:15 PM   Type of Therapy: Group Therapy  Participation Level: Did Not Attend. Patient invited to participate but declined.   Tilden Fossa, MSW, [place] Worker [facility] [phone]

## 2015-02-21 NOTE — Progress Notes (Signed)
Patient ID: Jacqueline Moyer, female   DOB: October 15, 1956, 59 y.o.   MRN: [id number] [facility] MD Progress Note  02/21/2015 12:17 PM Jacqueline Moyer  MRN:  [id number]  Subjective: Patient  Continues to report she is gradually feeling  better  Objective: Patient seen and care discussed with treatment team.  Patient has been severely depressed, but as noted she has stated over the last 2 days or so that she is starting to feel better, although her affect still seemed quite constricted. Today, however, she is starting to present with an improved affect, and smiled more often, and overall seemed more at ease, relaxed, and better engaged, more conversant. She is currently in the process of having her ex boyfriend deliver her car to her home so she can drive and is excited about having a new place to live ( states she rented a room) . She is interested in discussing disposition plans and where she will follow up after discharge. She is denying any medication side effects at present. No disruptive behaviors on unit. Going to groups, more visible on unit. Peripheral edema gradually decreasing.  Today not endorsing significant pain.         Principal Problem: MDD (major depressive disorder), recurrent severe, without psychosis Diagnosis:   Patient Active Problem List   Diagnosis Date Noted  . Swelling of lower extremity [M79.89]   . Drug-induced hepatitis [K75.9] 02/08/2015  . Suicide attempt by acetaminophen overdose [T39.1X2A]   . Overdose [T50.901A] 02/07/2015  . Intentional acetaminophen overdose [T39.1X2A] 02/07/2015  . Cellulitis and abscess of leg [L02.419, L03.119] 02/07/2015  . AKI (acute kidney injury) [N17.9] 02/07/2015  . Transaminasemia [R74.0] 02/07/2015  . MDD (major depressive disorder), recurrent severe, without psychosis [F33.2] 02/07/2015  . Paresthesias [R20.2] 01/28/2015  . 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 Dysphagia [R13.10] 01/28/2015  .  Breast cancer of lower-outer quadrant of left female breast [C50.512] 12/22/2014  . Proximal muscle weakness [M62.89] 12/09/2014  . Protein-calorie malnutrition, severe [E43] 11/21/2014  . Hypokalemia [E87.6] 11/19/2014  . Benign essential HTN [I10] 11/19/2014  . UTI (lower urinary tract infection) [N39.0] 11/19/2014  . Abdominal pain [R10.9] 11/19/2014  . GERD [K21.9] 08/29/2008  . History of malignant neoplasm of large intestine [Z85.038] 08/29/2008   Total Time spent with patient: 20 minutes   Past Medical History:  Past Medical History  Diagnosis Date  . Hypertension   . Thyroid disease     hyperthyroidism  . Chronic back pain   . Chronic neck pain   . Depression   . COPD (chronic obstructive pulmonary disease)   . Anemia   . Cancer approx 2006    colon cancer  . Blood transfusion without reported diagnosis   . GERD (gastroesophageal reflux disease)   . Arthritis   . Anxiety     Past Surgical History  Procedure Laterality Date  . Cholecystectomy    . Tonsillectomy    . Abdominal hysterectomy    . Hernia repair    . Colon surgery  approx. 2006    for colon cancer  . Gastric bypass  2000   Family History:  Family History  Problem Relation Age of Onset  . Colon cancer Father 26  . Prostate cancer Brother    Social History:  History  Alcohol Use No    Comment: denied      History  Drug Use No    Comment: denied     History   Social History  . Marital Status:  Divorced    Spouse Name: Leanna Sato   . Number of Children: 1  . Years of Education: 12+   Occupational History  . Disabled     Social History Main Topics  . Smoking status: Former Smoker -- 1.00 packs/day for 40 years    Types: Cigarettes    Quit date: 12/27/2013  . Smokeless tobacco: Current User    Last Attempt to Quit: 04/04/2013  . Alcohol Use: No     Comment: denied   . Drug Use: No     Comment: denied   . Sexual Activity: No   Other Topics Concern  . None   Social History  Narrative   Patient lives at home with life partner Leanna Sato    Patient has 1 child    Patient is disabled    Patient has a BS degree      Additional History:    Sleep: improved   Appetite:  Improving   Assessment:   Musculoskeletal: Strength & Muscle Tone: within normal limits Gait & Station: normal Patient leans: N/A   Psychiatric Specialty Exam: Physical Exam  ROS - no SOB,  No CP, no  cough.  No orthopnea, no PND, (+) Hip and leg pain, slow gait. Depression, no seizures. No rash. No fever, chills.  * of note, states pain is improving .   Blood pressure 140/87, pulse 72, temperature 98.4 F (36.9 C), temperature source Oral, resp. rate 18, height 5' 5.75" (1.67 m), weight 182 lb (82.555 kg).Body mass index is 29.6 kg/(m^2).  General Appearance: improved grooming   Eye Contact::  Good  Speech:  Normal Rate  Volume:  Normal  Mood:  Improving , less depressed   Affect:  Improved , less constricted   Thought Process:  Goal Directed and Linear  Orientation:  Full (Time, Place, and Person)  Thought Content:  No delusions, no hallucinations, not internally preoccupied  Suicidal Thoughts:  No - denies any thoughts of hurting herself at this time/ denies any passive SI as well   Homicidal Thoughts:  No  Memory:  Recent and remote grossly intact  Judgement:  Fair  Insight:  Fair  Psychomotor Activity:  Improved    Concentration:  Good  Recall:  Good  Fund of Knowledge:Good  Language: Good  Akathisia:  Negative  Handed:  Right  AIMS (if indicated):     Assets:  Communication Skills Desire for Improvement Resilience  ADL's:  Impaired  Cognition: WNL  Sleep:  Number of Hours: 6.25     Current Medications: Current Facility-Administered Medications  Medication Dose Route Frequency Provider Last Rate Last Dose  . anastrozole (ARIMIDEX) tablet 1 mg  1 mg Oral Daily Jenne Campus, MD   1 mg at 02/21/15 0746  . DULoxetine (CYMBALTA) DR capsule 30 mg  30 mg Oral  QHS Jenne Campus, MD   30 mg at 02/20/15 2130  . DULoxetine (CYMBALTA) DR capsule 60 mg  60 mg Oral Daily Jenne Campus, MD   60 mg at 02/21/15 0746  . furosemide (LASIX) tablet 10 mg  10 mg Oral Daily PRN Laverle Hobby, PA-C   10 mg at 02/20/15 4765  . gabapentin (NEURONTIN) capsule 200 mg  200 mg Oral BID Jenne Campus, MD   200 mg at 02/21/15 0746  . levothyroxine (SYNTHROID, LEVOTHROID) tablet 75 mcg  75 mcg Oral QAC breakfast Robbie Lis, MD   75 mcg at 02/21/15 [phone]  . mirtazapine (REMERON) tablet 15  mg  15 mg Oral QHS Jenne Campus, MD   15 mg at 02/20/15 2131  . multivitamin with minerals tablet 1 tablet  1 tablet Oral Daily Clayton Bibles, RD   1 tablet at 02/20/15 0851  . nicotine (NICODERM CQ - dosed in mg/24 hours) patch 21 mg  21 mg Transdermal Q0600 Niel Hummer, NP   21 mg at 02/21/15 7867  . oxyCODONE (Oxy IR/ROXICODONE) immediate release tablet 20 mg  20 mg Oral Q6H PRN Jenne Campus, MD   20 mg at 02/21/15 0747  . pantoprazole (PROTONIX) EC tablet 40 mg  40 mg Oral Daily Jenne Campus, MD   40 mg at 02/21/15 0746  . potassium chloride SA (K-DUR,KLOR-CON) CR tablet 40 mEq  40 mEq Oral TID Laverle Hobby, PA-C   40 mEq at 02/20/15 1725  . promethazine (PHENERGAN) tablet 25 mg  25 mg Oral Q6H PRN Kerrie Buffalo, NP   25 mg at 02/21/15 6720    Lab Results:  Results for orders placed or performed during the hospital encounter of 02/12/15 (from the past 48 hour(s))  Basic metabolic panel     Status: Abnormal   Collection Time: 02/20/15  6:40 AM  Result Value Ref Range   Sodium 139 135 - 145 mmol/L   Potassium 3.4 (L) 3.5 - 5.1 mmol/L   Chloride 104 96 - 112 mmol/L   CO2 29 19 - 32 mmol/L   Glucose, Bld 86 70 - 99 mg/dL   BUN 9 6 - 23 mg/dL   Creatinine, Ser 0.64 0.50 - 1.10 mg/dL   Calcium 8.2 (L) 8.4 - 10.5 mg/dL   GFR calc non Af Amer >90 >90 mL/min   GFR calc Af Amer >90 >90 mL/min    Comment: (NOTE) The eGFR has been calculated using the CKD EPI  equation. This calculation has not been validated in all clinical situations. eGFR's persistently <90 mL/min signify possible Chronic Kidney Disease.    Anion gap 6 5 - 15    Comment: Performed at [facility]  Magnesium     Status: None   Collection Time: 02/20/15  6:40 AM  Result Value Ref Range   Magnesium 1.6 1.5 - 2.5 mg/dL    Comment: Performed at [facility]    Physical Findings: AIMS: Facial and Oral Movements Muscles of Facial Expression: None, normal Lips and Perioral Area: None, normal Jaw: None, normal Tongue: None, normal,Extremity Movements Upper (arms, wrists, hands, fingers): None, normal Lower (legs, knees, ankles, toes): None, normal, Trunk Movements Neck, shoulders, hips: None, normal, Overall Severity Severity of abnormal movements (highest score from questions above): None, normal Incapacitation due to abnormal movements: None, normal Patient's awareness of abnormal movements (rate only patient's report): No Awareness, Dental Status Current problems with teeth and/or dentures?: No Does patient usually wear dentures?: No  CIWA:  CIWA-Ar Total: 2 COWS:  COWS Total Score: 2  Asssessment:    Patient is improving and today she is presenting with a noticeably improved affect. She is more future oriented, and is working on solidifying her discharge plans. One ongoing stressor is limited social support network. Tolerating medications well.  Treatment Plan Summary: Daily contact with patient to assess and evaluate symptoms and progress in treatment, Medication management, Plan continue inpatient treatment  and medications as below    Continue Cymbalta   60 mgrs QAM and 30 mgrs QHS Continue    Remeron to   15  mgrs QHS  Continue Levothyroxine 75 mcg QDAY  Continue Arimidex 1 mg QDAY Continue  Oxycodone to 20 mg Q6 hours for pain  Continue Neurontin   200 mgrs BID    Medical Decision Making:  Established Problem,  Stable/Improving (1), Review of Psycho-Social Stressors (1), Review or order clinical lab tests (1) and Review of Medication Regimen & Side Effects (2)     COBOS, FERNANDO 02/21/2015, 12:17 PM

## 2015-02-21 NOTE — Progress Notes (Signed)
D: Pt presents flat in affect and sad in mood. Pt remained in her room this evening. Pt denied any SI/HI/AVH. Pt reported sharp r. hip pain rated at level of 8 out of 10. Pt declined writer's offer for any assistance for toileting at this time. Pt reports to be independent of such ADL. Pt offered a snack. Pt requested ice chips only for her Coke.  A: Writer administered scheduled and prn medications to pt, per MD orders. Continued support and availability as needed was extended to this pt. Staff continue to monitor pt with q13min checks.  R: No adverse drug reactions noted. Pt receptive to treatment. Pt remains safe at this time.

## 2015-02-21 NOTE — Progress Notes (Signed)
Recreation Therapy Notes  Date: 03.30.2016 Time: 9:30am Location: [address] Group Room   Group Topic: Stress Management  Goal Area(s) Addresses:  Patient will actively participate in stress management techniques presented during session.   Behavioral Response: Did not attend.   Laureen Ochs Cindia Hustead, LRT/CTRS  Jhalil Silvera L 02/21/2015 3:11 PM

## 2015-02-22 NOTE — Clinical Social Work Note (Signed)
CSW spoke with ex-boyfriend Leanna Sato [phone] to confirm plans to pick her car up from the impound lot tomorrow. John reports that he will be at hospital around 9 to 9:30 am to pick up patient's license, check, and car registration and that he will return around 1 pm with her car and belongings which he will drop off at the receptions desk.   Tilden Fossa, MSW, [place] Worker [facility] [phone]

## 2015-02-22 NOTE — Progress Notes (Signed)
D: Patient denies SI/HI and A/V hallucinations; patient reports sleep is good; reports appetite is fair; reports energy level is normal ; reports ability to concentrate is good; rates depression as 2/10; rates hopelessness 2/10; rates anxiety as 2/10; patient reports " I feel better today, I got up this morning, I think that it was the potassium and multivitamin"  A: Monitored q 15 minutes; patient encouraged to attend groups; patient educated about medications; patient given medications per physician orders; patient encouraged to express feelings and/or concerns  R: Patient is brighter in affect; patient is out in the milieu participating more; patient is cooperative; patient is ambulating well; patient's interaction with staff and peers is appropriate; patient was able to set goal to talk with staff 1:1 when having feelings of SI; patient is taking medications as prescribed and tolerating medications; patient is attending all groups

## 2015-02-22 NOTE — Progress Notes (Signed)
The focus of this group is to educate the patient on the purpose and policies of crisis stabilization and provide a format to answer questions about their admission.  The group details unit policies and expectations of patients while admitted. Patient attended this group and was engaging. 

## 2015-02-22 NOTE — BHH Group Notes (Signed)
[place] LCSW Group Therapy 02/22/2015 1:15 PM Type of Therapy: Group Therapy Participation Level: Active  Participation Quality: Attentive, Sharing and Supportive  Affect: Depressed and Flat  Cognitive: Alert and Oriented  Insight: Developing/Improving and Engaged  Engagement in Therapy: Developing/Improving and Engaged  Modes of Intervention: Activity, Clarification, Confrontation, Discussion, Education, Exploration, Limit-setting, Orientation, Problem-solving, Rapport Building, Art therapist, Socialization and Support  Summary of Progress/Problems: Patient was attentive and engaged with speaker from [place]. Patient was attentive to speaker while they shared their story of dealing with mental health and overcoming it. Patient expressed interest in their programs and services and received information on their agency. Patient processed ways they can relate to the speaker.   Tilden Fossa, MSW, [place] Worker [facility] [phone]

## 2015-02-22 NOTE — Progress Notes (Signed)
Patient ID: Jacqueline Moyer, female   DOB: August 27, 1956, 59 y.o.   MRN: [id number] [facility] MD Progress Note  02/22/2015 5:32 PM Jacqueline Moyer  MRN:  [id number]  Subjective:  She continues to report that she feels better . In particular, states that her sleep has been much better on the Remeron, which is resulting in her feeling more rested. She states she feels less depressed and currently is feeling " better than I have in a while". She does continue to feel depressed, but states " certainly less than before". She is less ruminative, but does express sense of disappointment that her adult daughter, who lives in [place], has not been more supportive . Denies medication side effects.  Objective: Patient seen and care discussed with treatment team.  Significantly improved mood and affect compared to her admission status , and in particular over the last 2-3 days there has been noticeable improvement, which she agrees with and which she attributes to Remeron trial, helping her sleep better and feel more rested. Edema has decreased but is still present. She can walk better now, and her gait is steadier, and less painful. Denies falls, and states she feels steady at this time when ambulating. No disruptive /agitated behaviors, pleasant upon approach, more participative in milieu. Looking forward to discharge tomorrow- plans to move in to a room she has rented and to get ongoing outpatient treatment. Also states she plans to follow up at [facility] in order to continue management of her Breast Ca and to get scheduled for lumpectomy. Responds well to support and encouragement / praise regarding her improvement.         Principal Problem: MDD (major depressive disorder), recurrent severe, without psychosis Diagnosis:   Patient Active Problem List   Diagnosis Date Noted  . Swelling of lower extremity [M79.89]   . Drug-induced hepatitis [K75.9] 02/08/2015  . Suicide attempt by acetaminophen  overdose [T39.1X2A]   . Overdose [T50.901A] 02/07/2015  . Intentional acetaminophen overdose [T39.1X2A] 02/07/2015  . Cellulitis and abscess of leg [L02.419, L03.119] 02/07/2015  . AKI (acute kidney injury) [N17.9] 02/07/2015  . Transaminasemia [R74.0] 02/07/2015  . MDD (major depressive disorder), recurrent severe, without psychosis [F33.2] 02/07/2015  . Paresthesias [R20.2] 01/28/2015  . 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 Dysphagia [R13.10] 01/28/2015  . Breast cancer of lower-outer quadrant of left female breast [C50.512] 12/22/2014  . Proximal muscle weakness [M62.89] 12/09/2014  . Protein-calorie malnutrition, severe [E43] 11/21/2014  . Hypokalemia [E87.6] 11/19/2014  . Benign essential HTN [I10] 11/19/2014  . UTI (lower urinary tract infection) [N39.0] 11/19/2014  . Abdominal pain [R10.9] 11/19/2014  . GERD [K21.9] 08/29/2008  . History of malignant neoplasm of large intestine [Z85.038] 08/29/2008   Total Time spent with patient: 20 minutes   Past Medical History:  Past Medical History  Diagnosis Date  . Hypertension   . Thyroid disease     hyperthyroidism  . Chronic back pain   . Chronic neck pain   . Depression   . COPD (chronic obstructive pulmonary disease)   . Anemia   . Cancer approx 2006    colon cancer  . Blood transfusion without reported diagnosis   . GERD (gastroesophageal reflux disease)   . Arthritis   . Anxiety     Past Surgical History  Procedure Laterality Date  . Cholecystectomy    . Tonsillectomy    . Abdominal hysterectomy    . Hernia repair    . Colon surgery  approx. 2006  for colon cancer  . Gastric bypass  2000   Family History:  Family History  Problem Relation Age of Onset  . Colon cancer Father 56  . Prostate cancer Brother    Social History:  History  Alcohol Use No    Comment: denied      History  Drug Use No    Comment: denied     History   Social History  . Marital Status:  Divorced    Spouse Name: Leanna Sato   . Number of Children: 1  . Years of Education: 12+   Occupational History  . Disabled     Social History Main Topics  . Smoking status: Former Smoker -- 1.00 packs/day for 40 years    Types: Cigarettes    Quit date: 12/27/2013  . Smokeless tobacco: Current User    Last Attempt to Quit: 04/04/2013  . Alcohol Use: No     Comment: denied   . Drug Use: No     Comment: denied   . Sexual Activity: No   Other Topics Concern  . None   Social History Narrative   Patient lives at home with life partner Leanna Sato    Patient has 1 child    Patient is disabled    Patient has a BS degree      Additional History:    Sleep: improved   Appetite:  Improving   Assessment:   Musculoskeletal: Strength & Muscle Tone: within normal limits Gait & Station: normal Patient leans: N/A   Psychiatric Specialty Exam: Physical Exam  ROS - no SOB,  No CP, no  cough.  No orthopnea, no PND, (+) Hip and leg pain, slow gait. Depression, no seizures. No rash. No fever, chills.  * of note, states pain is improving .   Blood pressure 140/87, pulse 72, temperature 98.4 F (36.9 C), temperature source Oral, resp. rate 18, height 5' 5.75" (1.67 m), weight 182 lb (82.555 kg).Body mass index is 29.6 kg/(m^2).  General Appearance: improved grooming   Eye Contact::  Good  Speech:  Normal Rate  Volume:  Normal  Mood:  Improving , less depressed   Affect:  More reactive, smiles at times appropriately  Thought Process:  Goal Directed and Linear  Orientation:  Full (Time, Place, and Person)  Thought Content:  No delusions, no hallucinations, not internally preoccupied  Suicidal Thoughts:  No - denies any thoughts of hurting herself at this time/ denies any passive SI as well   Homicidal Thoughts:  No  Memory:  Recent and remote grossly intact  Judgement:  Fair  Insight:  Fair  Psychomotor Activity:  Improved  , ambulates better , with less pain   Concentration:  Good  Recall:  Good  Fund of Knowledge:Good  Language: Good  Akathisia:  Negative  Handed:  Right  AIMS (if indicated):     Assets:  Communication Skills Desire for Improvement Resilience  ADL's:  Impaired  Cognition: WNL  Sleep:  Number of Hours: 6.75     Current Medications: Current Facility-Administered Medications  Medication Dose Route Frequency Provider Last Rate Last Dose  . anastrozole (ARIMIDEX) tablet 1 mg  1 mg Oral Daily Jenne Campus, MD   1 mg at 02/22/15 0801  . DULoxetine (CYMBALTA) DR capsule 30 mg  30 mg Oral QHS Jenne Campus, MD   30 mg at 02/21/15 2220  . DULoxetine (CYMBALTA) DR capsule 60 mg  60 mg Oral Daily Jenne Campus, MD  60 mg at 02/22/15 0801  . furosemide (LASIX) tablet 10 mg  10 mg Oral Daily PRN Laverle Hobby, PA-C   10 mg at 02/20/15 8119  . gabapentin (NEURONTIN) capsule 200 mg  200 mg Oral BID Jenne Campus, MD   200 mg at 02/22/15 1636  . levothyroxine (SYNTHROID, LEVOTHROID) tablet 75 mcg  75 mcg Oral QAC breakfast Robbie Lis, MD   75 mcg at 02/22/15 0620  . mirtazapine (REMERON) tablet 15 mg  15 mg Oral QHS Jenne Campus, MD   15 mg at 02/21/15 2220  . multivitamin with minerals tablet 1 tablet  1 tablet Oral Daily Clayton Bibles, RD   1 tablet at 02/20/15 0851  . nicotine (NICODERM CQ - dosed in mg/24 hours) patch 21 mg  21 mg Transdermal Q0600 Niel Hummer, NP   21 mg at 02/22/15 0802  . oxyCODONE (Oxy IR/ROXICODONE) immediate release tablet 20 mg  20 mg Oral Q6H PRN Jenne Campus, MD   20 mg at 02/22/15 1257  . pantoprazole (PROTONIX) EC tablet 40 mg  40 mg Oral Daily Jenne Campus, MD   40 mg at 02/22/15 0801  . promethazine (PHENERGAN) tablet 25 mg  25 mg Oral Q6H PRN Kerrie Buffalo, NP   25 mg at 02/21/15 1478    Lab Results:  No results found for this or any previous visit (from the past 48 hour(s)).  Physical Findings: AIMS: Facial and Oral Movements Muscles of Facial Expression: None,  normal Lips and Perioral Area: None, normal Jaw: None, normal Tongue: None, normal,Extremity Movements Upper (arms, wrists, hands, fingers): None, normal Lower (legs, knees, ankles, toes): None, normal, Trunk Movements Neck, shoulders, hips: None, normal, Overall Severity Severity of abnormal movements (highest score from questions above): None, normal Incapacitation due to abnormal movements: None, normal Patient's awareness of abnormal movements (rate only patient's report): No Awareness, Dental Status Current problems with teeth and/or dentures?: No Does patient usually wear dentures?: No  CIWA:  CIWA-Ar Total: 2 COWS:  COWS Total Score: 2  Asssessment:     Improving over the last 2-3 days in particular, with less severe depression, improved range of affect, less hopelessness and more future oriented , and sleeping better. She is tolerating medications well and currently denies side effects.  Treatment Plan Summary: Daily contact with patient to assess and evaluate symptoms and progress in treatment, Medication management, Plan continue inpatient treatment  and medications as below   Consider discharge soon as she improves Continue Cymbalta   60 mgrs QAM and 30 mgrs QHS Continue    Remeron to   15  mgrs QHS  Continue Levothyroxine 75 mcg QDAY  Continue Arimidex 1 mg QDAY Continue  Oxycodone to 20 mg Q6 hours for pain  Continue Neurontin   200 mgrs BID  Will recheck BMP prior to discharge to follow up on K+ level and to rule out antidepressant related hyponatremia   Medical Decision Making:  Established Problem, Stable/Improving (1), Review of Psycho-Social Stressors (1), Review or order clinical lab tests (1) and Review of Medication Regimen & Side Effects (2)     Taysom Glymph 02/22/2015, 5:32 PM

## 2015-02-23 ENCOUNTER — Encounter (HOSPITAL_COMMUNITY): Payer: Self-pay | Admitting: Registered Nurse

## 2015-02-23 DIAGNOSIS — F329 Major depressive disorder, single episode, unspecified: Secondary | ICD-10-CM | POA: Diagnosis present

## 2015-02-23 LAB — BASIC METABOLIC PANEL
Anion gap: 7 (ref 5–15)
BUN: 9 mg/dL (ref 6–23)
CALCIUM: 8 mg/dL — AB (ref 8.4–10.5)
CO2: 28 mmol/L (ref 19–32)
Chloride: 104 mmol/L (ref 96–112)
Creatinine, Ser: 0.65 mg/dL (ref 0.50–1.10)
Glucose, Bld: 77 mg/dL (ref 70–99)
Potassium: 4.2 mmol/L (ref 3.5–5.1)
SODIUM: 139 mmol/L (ref 135–145)

## 2015-02-23 LAB — TSH: TSH: 1.474 u[IU]/mL (ref 0.350–4.500)

## 2015-02-23 MED ORDER — TRAMADOL HCL 50 MG PO TABS: 50.0000 mg | ORAL_TABLET | Freq: Two times a day (BID) | ORAL | Status: AC | PRN

## 2015-02-23 MED ORDER — MAGNESIUM HYDROXIDE 400 MG/5ML PO SUSP: 30.0000 mL | Freq: Every day | ORAL | Status: DC | PRN

## 2015-02-23 MED ORDER — LEVOTHYROXINE SODIUM 75 MCG PO TABS: 75.0000 ug | ORAL_TABLET | Freq: Every day | ORAL | Status: AC

## 2015-02-23 MED ORDER — MIRTAZAPINE 15 MG PO TABS: 15.0000 mg | ORAL_TABLET | Freq: Every day | ORAL | Status: AC

## 2015-02-23 MED ORDER — ACETAMINOPHEN 325 MG PO TABS: 650.0000 mg | ORAL_TABLET | Freq: Four times a day (QID) | ORAL | Status: DC | PRN

## 2015-02-23 MED ORDER — GABAPENTIN 100 MG PO CAPS: 200.0000 mg | ORAL_CAPSULE | Freq: Two times a day (BID) | ORAL | Status: AC

## 2015-02-23 MED ORDER — DULOXETINE HCL 60 MG PO CPEP: 60.0000 mg | ORAL_CAPSULE | Freq: Two times a day (BID) | ORAL | Status: AC

## 2015-02-23 MED ORDER — BISACODYL 10 MG RE SUPP
10.0000 mg | Freq: Once | RECTAL | Status: AC
  Administered 2015-02-23: 10 mg via RECTAL
  Filled 2015-02-23 (×2): qty 1

## 2015-02-23 MED ORDER — FUROSEMIDE 20 MG PO TABS: 10.0000 mg | ORAL_TABLET | Freq: Every day | ORAL | Status: AC | PRN

## 2015-02-23 MED ORDER — DULOXETINE HCL 60 MG PO CPEP
60.0000 mg | ORAL_CAPSULE | Freq: Two times a day (BID) | ORAL | Status: DC
  Filled 2015-02-23 (×2): qty 1
  Filled 2015-02-23 (×2): qty 20

## 2015-02-23 MED ORDER — ALUM & MAG HYDROXIDE-SIMETH 200-200-20 MG/5ML PO SUSP: 30.0000 mL | ORAL | Status: DC | PRN

## 2015-02-23 NOTE — Tx Team (Signed)
Interdisciplinary Treatment Plan Update (Adult) Date: 02/23/2015   Time Reviewed: 9:30 AM  Progress in Treatment: Attending groups: Yes Participating in groups: Yes Taking medication as prescribed: Yes Tolerating medication: Yes Family/Significant other contact made: Yes, CSW has spoken with patient's daughter and ex-boyfriend Patient understands diagnosis: Yes Discussing patient identified problems/goals with staff: Yes Medical problems stabilized or resolved: Yes Denies suicidal/homicidal ideation: Yes, denies Issues/concerns per patient self-inventory: Yes Other:  New problem(s) identified: N/A  Discharge Plan or Barriers:  3/25: CSW continuing to assess. Patient is currently homeless but states that she may have found housing in [facility]. Patient will follow up with outpatient services at discharge.  3/29: Patient plans to discharge to residence in [facility] and would like a referral for outpatient services. Patient lacks transportation and funds until Friday 02/23/15 and lacks social supports. Patient has medical issues including edema which make ambulation difficult. Patient continues to be on 1:1 observation due to being a high fall risk.   4/1: Patient reports improvement in her symptoms and plans to discharge to [facility] today. She plans to follow up with outpatient services.   Reason for Continuation of Hospitalization:  Depression Anxiety Medication Stabilization   Comments: N/A  Estimated length of stay: Discharge anticipated for today 02/23/15.  For review of initial/current patient goals, please see plan of care. Patient is a 59 year old Caucasian female admitted following an intentional overdose and increasing depression. Patient is recently homeless in [place] due to the breakup of her 8 year relationship. Patient will benefit from crisis stabilization, medication evaluation, group therapy, and psycho education in addition to case management for  discharge planning. Patient and CSW reviewed pt's identified goals and treatment plan. Pt verbalized understanding and agreed to treatment plan.  Attendees: Patient:    Family:    Physician: Dr. Parke Poisson 02/23/2015 9:30 AM  Nursing: Desma Paganini, Lawernce Pitts, RN 02/23/2015 9:30 AM  Clinical Social Worker: Erasmo Downer Anah Billard,  [place] 02/23/2015 9:30 AM  Other: Joette Catching, LCSW 02/23/2015 9:30 AM   02/23/2015 9:30 AM   02/23/2015 9:30 AM  Other: Genella Mech, May Augustin, NP  02/23/2015 9:30 AM  Other:      Scribe for Treatment Team:  Tilden Fossa, MSW, [place] [phone]

## 2015-02-23 NOTE — Progress Notes (Signed)
Patient discharged per physician order; patient denies SI/HI and A/V hallucinations; patient received samples, prescriptions, and copy of AVS after it was reviewed; patient had no other questions or concerns at this time; patient verbalized and received all belongings including the belongings brought in by the visitor; patient left the unit ambulatory with her cane and escorted to her own car to drive

## 2015-02-23 NOTE — Clinical Social Work Note (Signed)
CSW accompanied patient to search room along with security guard to retrieve belongings from her locker. Patient took out her driver's license, car registration, and wrote a check for $580 which were all put in a sealed envelope and left with the receptionist for her ex-boyfriend Leanna Sato to pick up between 9 to 9:30 am today. He has agreed to pick up patient's car from the impound lot and drop it back off at the hospital around 1 pm today- patient is agreeable to plan. Patient also took $20 out of her purse to keep with her for snacks.   Tilden Fossa, MSW, [place] Worker [facility] [phone]
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
## 2015-02-23 NOTE — Progress Notes (Signed)
Pt reports she is doing better and is looking forward to discharge probably tomorrow.  She denies SI/HI/AV.  She is more steady on her feet and using her cane when she ambulates.  The edema has decreased in her ankles.  She says she is resting better.  She plans to f/u in outpt after discharge.  Pt feels her meds are working for her and plans to continue current meds after discharge.  Pt still presents flat/depressed, but brightens in conversation.  Pt makes her needs known to staff.  Support and encouragement offered.  Safety maintained with q15 minute checks.

## 2015-02-23 NOTE — BHH Group Notes (Signed)
[facility] LCSW Group Therapy 02/23/2015 1:15 PM Type of Therapy: Group Therapy Participation Level: Active  Participation Quality: Attentive, Sharing and Supportive  Affect: Appropriate  Cognitive: Alert and Oriented  Insight: Developing/Improving and Engaged  Engagement in Therapy: Developing/Improving and Engaged  Modes of Intervention: Clarification, Confrontation, Discussion, Education, Exploration, Limit-setting, Orientation, Problem-solving, Rapport Building, Art therapist, Socialization and Support  Summary of Progress/Problems: The topic for today was feelings about relapse. Pt discussed what relapse prevention is to them and identified triggers that they are on the path to relapse. Pt processed their feeling towards relapse and was able to relate to peers. Pt discussed coping skills that can be used for relapse prevention. Patient identified her involvement in unhealthy relationships as a relapse behavior and discussed her fear of being alone. CSW and other group members provided patient with emotional support and encouragement.  Tilden Fossa, MSW, [place] Worker [facility] [phone]

## 2015-02-23 NOTE — Progress Notes (Signed)
  [facility] Adult Case Management Discharge Plan :  Will you be returning to the same living situation after discharge:  No. Patient plans to discharge to an apartment in [place]  At discharge, do you have transportation home?: Yes,  patient's ex-boyfriend has agreed to bring patient's car to hospital this afternoon Do you have the ability to pay for your medications: Yes,  patient will be provided with prescriptions at discharge  Release of information consent forms completed and in the chart;  Patient's signature needed at discharge.  Patient to Follow up at: Follow-up Information    Follow up with [facility].   Why:  Walk-in assessment for therapy and medication management services Monday-Thursday between 8-3pm and Friday 8-1pm.    Contact information:   [address], [place] fax [phone]      Follow up with [facility] On 02/26/2015.   Why:  Therapy appointment with Kathrynn Running on Monday April 4th at 1 pm. Please bring your insurance card and call office if you need to reschedule your appointment.   Contact information:   [address], [place] [phone] fax [phone]      Patient denies SI/HI: Yes,  denies    Safety Planning and Suicide Prevention discussed: Yes,  with patient  Have you used any form of tobacco in the last 30 days? (Cigarettes, Smokeless Tobacco, Cigars, and/or Pipes): Yes  Has patient been referred to the Quitline?: Patient refused referral  [place], Casimiro Needle 02/23/2015, 10:12 AM

## 2015-02-23 NOTE — Progress Notes (Signed)
Recreation Therapy Notes  Date: 04.01.2016 Time: 9:30am Location: [address] Group Room   Group Topic: Stress Management  Goal Area(s) Addresses:  Patient will actively participate in stress management techniques presented during session.   Behavioral Response: Did not attend.   Laureen Ochs Ronae Noell, LRT/CTRS  Serine Kea L 02/23/2015 1:11 PM

## 2015-02-23 NOTE — BHH Group Notes (Signed)
   [facility] LCSW Aftercare Discharge Planning Group Note  02/23/2015  8:45 AM   Participation Quality: Alert, Appropriate and Oriented  Mood/Affect: Appropriate  Depression Rating: 0  Anxiety Rating: 0  Thoughts of Suicide: Pt denies SI/HI  Will you contract for safety? Yes  Current AVH: Pt denies  Plan for Discharge/Comments: Pt attended discharge planning group and actively participated in group. CSW provided pt with today's workbook. Patient plans to discharge to an apartment in [place] and will follow up with outpatient services.  Transportation Means: Pt reports access to transportation- ex-boyfriend Leanna Sato has agreed to bring her car to hospital this afternoon.  Supports: No supports mentioned at this time  Tilden Fossa, MSW, [place] Worker [facility] [phone]

## 2015-02-23 NOTE — BHH Suicide Risk Assessment (Signed)
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  Total Time spent with patient: 30 minutes  Musculoskeletal: Strength & Muscle Tone: within normal limits Gait & Station: gait significantly improved compared to admission. Patient leans: N/A  Psychiatric Specialty Exam: Physical Exam  ROS  Blood pressure 113/74, pulse 98, temperature 99.2 F (37.3 C), temperature source Oral, resp. rate 20, height 5' 5.75" (1.67 m), weight 182 lb (82.555 kg).Body mass index is 29.6 kg/(m^2).  General Appearance: improved grooming   Eye Contact::  Good  Speech:  Normal [id number]  Volume:  Normal  Mood:  improved, feels less depressed, states she has felt the best she has in months .  Affect:  Appropriate, smiling at times appropriately   Thought Process:  Goal Directed and Linear  Orientation:  Full (Time, Place, and Person)  Thought Content:  no hallucinations, no delusions   Suicidal Thoughts:  No- denies any suicidal ideations, denies any thoughts of hurting self or anyone else   Homicidal Thoughts:  No  Memory:  recent and remote grossly intact   Judgement:  Other:  improved   Insight:  Present  Psychomotor Activity:  Normal  Concentration:  Good  Recall:  Good  Fund of Knowledge:Good  Language: Good  Akathisia:  Negative  Handed:  Right  AIMS (if indicated):     Assets:  Desire for Improvement Resilience Talents/Skills  Sleep:  Number of Hours: 6  Cognition: WNL  ADL's: improved    Have you used any form of tobacco in the last 30 days? (Cigarettes, Smokeless Tobacco, Cigars, and/or Pipes): Yes  Has this patient used any form of tobacco in the last 30 days? (Cigarettes, Smokeless Tobacco, Cigars, and/or Pipes) Yes, A prescription for an FDA-approved tobacco cessation medication was offered at discharge and the patient refused  Mental Status Per Nursing Assessment::   On Admission:  Suicidal ideation indicated by  patient  Current Mental Status by Physician: At this time patient is much improved compared to her admission. Her mood has been improving and at this time she states she feels much better. Her affect is less constricted and has been brighter, she is not thought disordered or psychotic, and she is more future oriented at this time, and less pessimistic/more hopeful about her future overall. She is not suicidal nor does she have any homicidal ideations.  Loss Factors: Chronic pain issues, limited support network, not working, break up with BF.  Historical Factors:  History of depression, no prior episodes of suicide attempt prior to this admission.   Risk Reduction Factors:   Sense of responsibility to family and Positive coping skills or problem solving skills  Continued Clinical Symptoms:  As noted currently improved compared to admission . Improved mood and affect, and denying suicidal ideations . Gait improved as lower extremity  edema has decreased . Of note, follow up labs reviewed , K+ normal at this time and TSH within normal limits. Tolerating medications well.  Cognitive Features That Contribute To Risk:  No gross cognitive deficits noted upon discharge. Is alert , attentive, and oriented x 3   Suicide Risk:  Mild:  Suicidal ideation of limited frequency, intensity, duration, and specificity.  There are no identifiable plans, no associated intent, mild dysphoria and related symptoms, good self-control (both objective and subjective assessment), few other risk factors, and identifiable protective factors, including available and accessible social support.  Principal Problem: MDD (major depressive disorder), recurrent severe,  without psychosis Discharge Diagnoses:  Patient Active Problem List   Diagnosis Date Noted  . Swelling of lower extremity [M79.89]   . Drug-induced hepatitis [K75.9] 02/08/2015  . Suicide attempt by acetaminophen overdose [T39.1X2A]   . Overdose [T50.901A]  02/07/2015  . Intentional acetaminophen overdose [T39.1X2A] 02/07/2015  . Cellulitis and abscess of leg [L02.419, L03.119] 02/07/2015  . AKI (acute kidney injury) [N17.9] 02/07/2015  . Transaminasemia [R74.0] 02/07/2015  . MDD (major depressive disorder), recurrent severe, without psychosis [F33.2] 02/07/2015  . Paresthesias [R20.2] 01/28/2015  . 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 Dysphagia [R13.10] 01/28/2015  . Breast cancer of lower-outer quadrant of left female breast [C50.512] 12/22/2014  . Proximal muscle weakness [M62.89] 12/09/2014  . Protein-calorie malnutrition, severe [E43] 11/21/2014  . Hypokalemia [E87.6] 11/19/2014  . Benign essential HTN [I10] 11/19/2014  . UTI (lower urinary tract infection) [N39.0] 11/19/2014  . Abdominal pain [R10.9] 11/19/2014  . GERD [K21.9] 08/29/2008  . History of malignant neoplasm of large intestine [Z85.038] 08/29/2008    Follow-up Information    Follow up with [facility].   Why:  Walk-in assessment for therapy and medication management services Monday-Thursday between 8-3pm and Friday 8-1pm.    Contact information:   [address], [place] fax [phone]      Follow up with [facility] On 02/26/2015.   Why:  Therapy appointment with Kathrynn Running on Monday April 4th at 1 pm. Please bring your insurance card and call office if you need to reschedule your appointment.   Contact information:   [place] [address], [place] [phone] fax [phone]      Plan Of Care/Follow-up recommendations:  Activity:  As tolerated Diet:  High protein Tests:  NA Other:  As  below  Is patient on multiple antipsychotic therapies at discharge:  No   Has Patient had three or more failed trials of antipsychotic monotherapy by history:  No  Recommended Plan for Multiple Antipsychotic Therapies: NA   Patient is leaving unit in  good spirits. Plans to live independently and to follow up as above . Plans to follow up at [facility] for ongoing management of Breast Cancer.     COBOS, FERNANDO 02/23/2015, 9:19 AM

## 2015-02-26 ENCOUNTER — Ambulatory Visit: Payer: Self-pay | Admitting: *Deleted

## 2015-02-27 NOTE — Progress Notes (Signed)
Patient Discharge Instructions:  After Visit Summary (AVS):   Faxed to:  02/27/15 Discharge Summary Note:   Faxed to:  02/27/15 Psychiatric Admission Assessment Note:   Faxed to:  02/27/15 Suicide Risk Assessment - Discharge Assessment:   Faxed to:  02/27/15 Faxed/Sent to the Next Level Care provider:  02/27/15 Faxed to [facility] [place], 02/27/2015, 3:53 PM

## 2015-05-07 ENCOUNTER — Telehealth: Payer: Self-pay | Admitting: Hematology and Oncology

## 2015-05-07 NOTE — Telephone Encounter (Signed)
Mailed pt medical records to [facility] in [place]

## 2015-12-11 ENCOUNTER — Encounter: Payer: Self-pay | Admitting: Gastroenterology

## 2016-10-13 IMAGING — CT CT ABD-PELV W/ CM
2 of 7 series · 17 of 46 positions shown, 19 images · IV contrast (omnipaque)
Comparison: 01/03/2009

CLINICAL DATA: Nausea, vomiting, constipation.

EXAM:
CT ABDOMEN AND PELVIS WITH CONTRAST
TECHNIQUE: Multidetector CT imaging of the abdomen and pelvis was performed
using the standard protocol following bolus administration of
intravenous contrast.
CONTRAST:  100mL OMNIPAQUE IOHEXOL 300 MG/ML  SOLN

[Series 2: rtn a/p with · axial · 0.69mm/px · z∈[+750,+1120]mm · 14 of 84 slices shown, 16 images]
[im 5/84  soft-tissue]
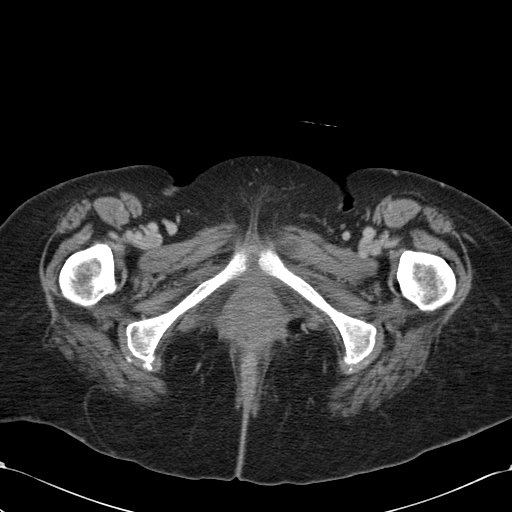
[im 5/84  bone]
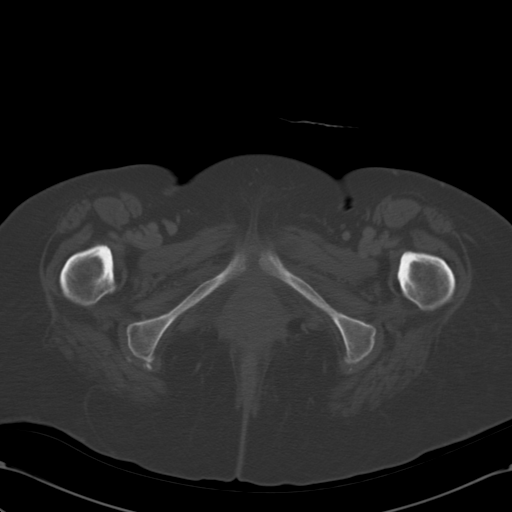
[im 10/84  soft-tissue]
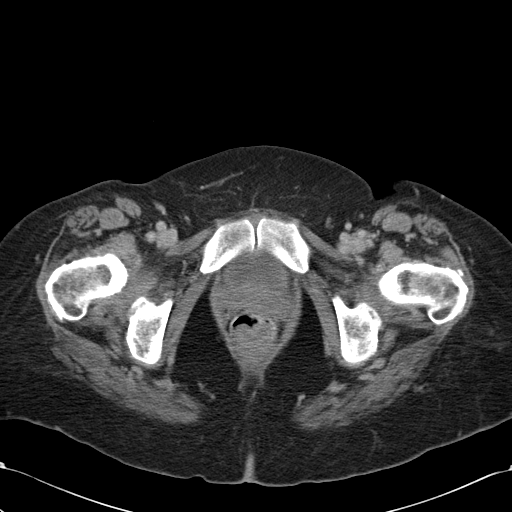
[im 15/84  soft-tissue]
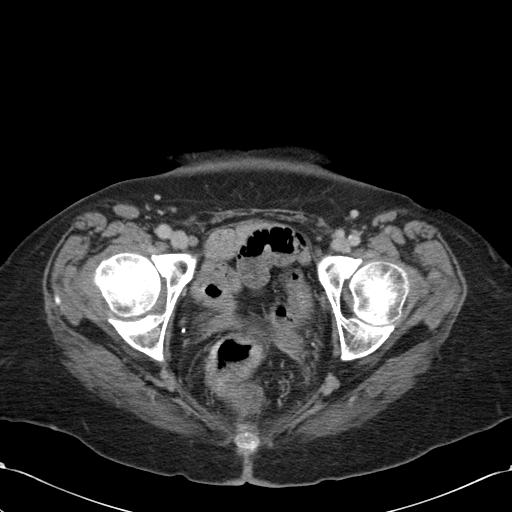
[im 25/84  soft-tissue]
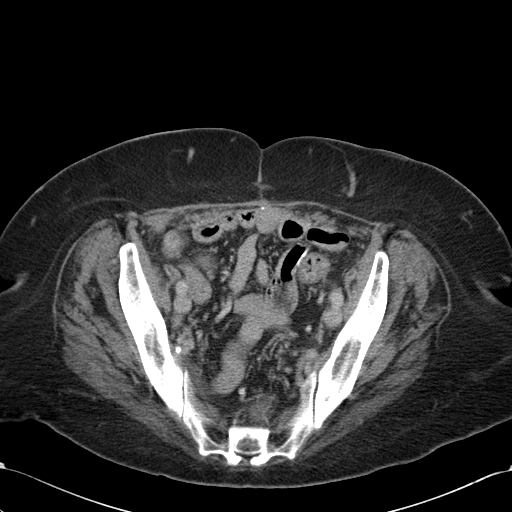
[im 30/84  soft-tissue]
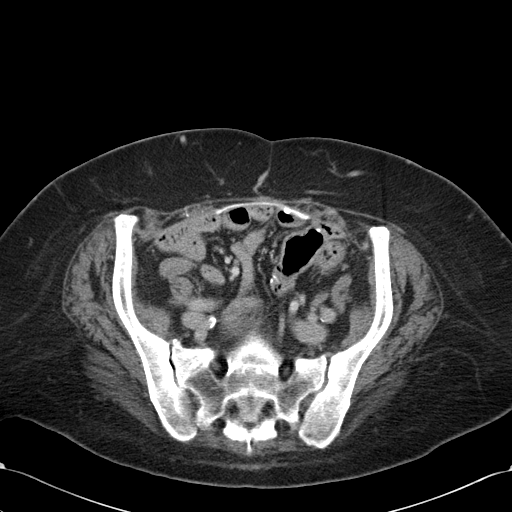
[im 35/84  soft-tissue]
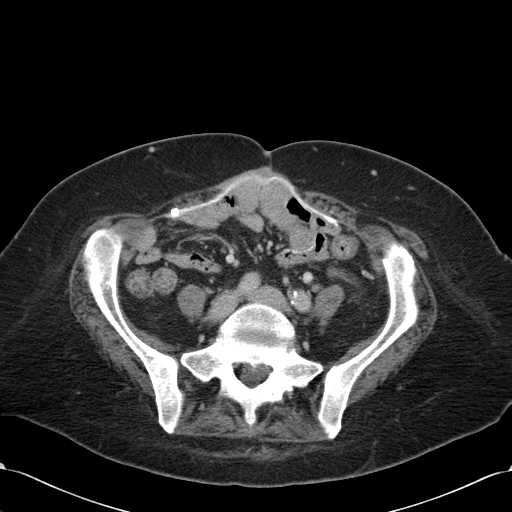
[im 40/84  soft-tissue]
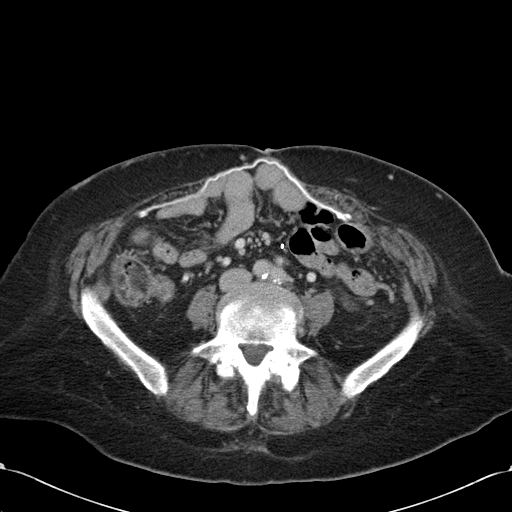
[im 44/84  soft-tissue]
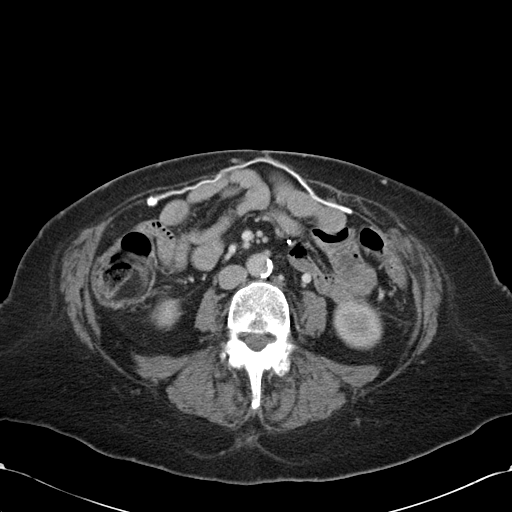
[im 49/84  soft-tissue]
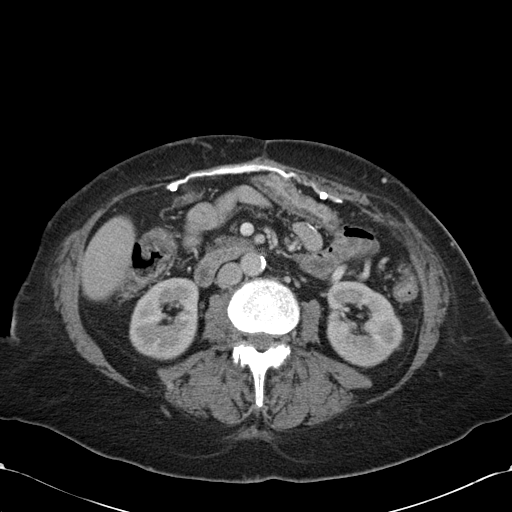
[im 49/84  bone]
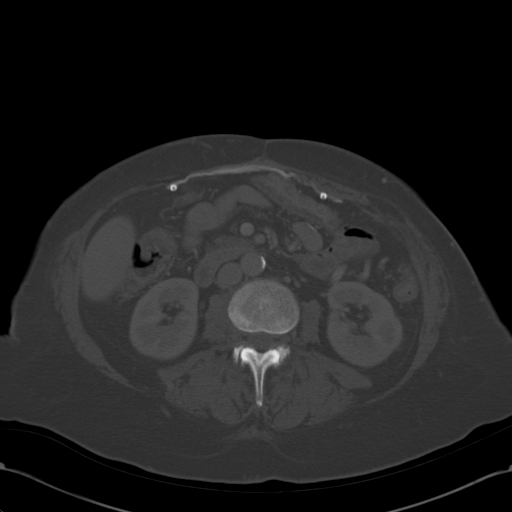
[im 54/84  soft-tissue]
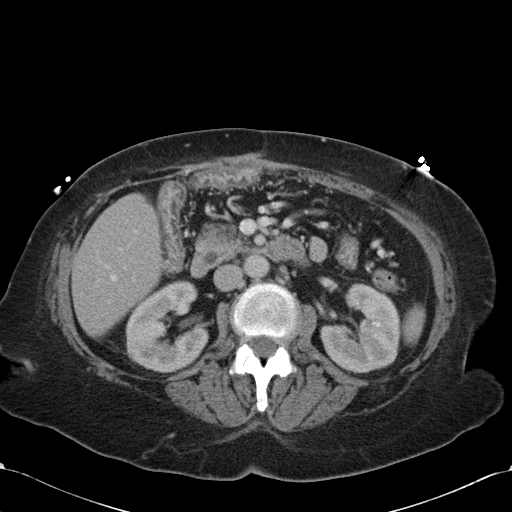
[im 64/84  soft-tissue]
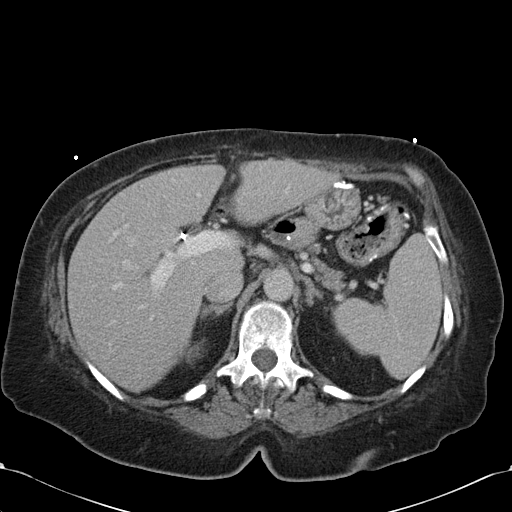
[im 69/84  soft-tissue]
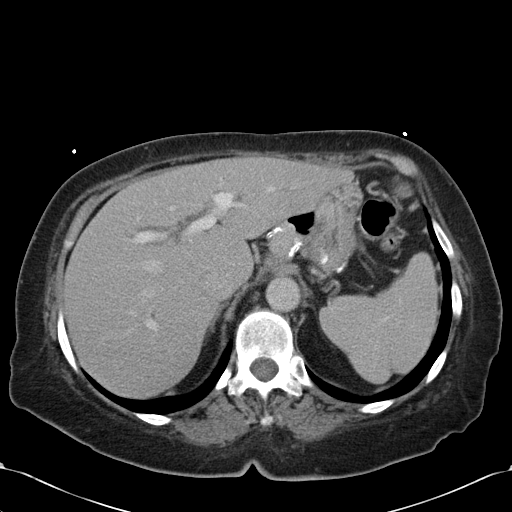
[im 74/84  soft-tissue]
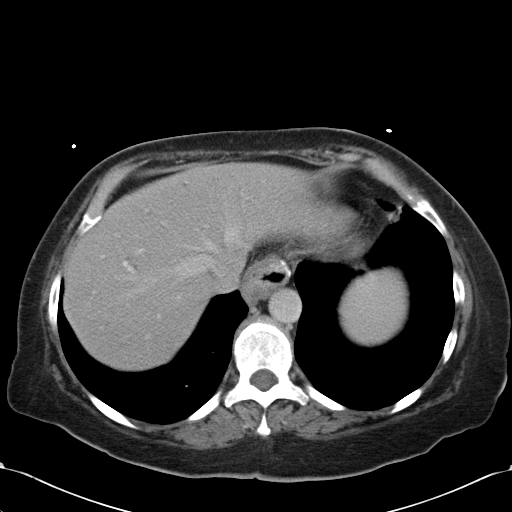
[im 79/84  soft-tissue]
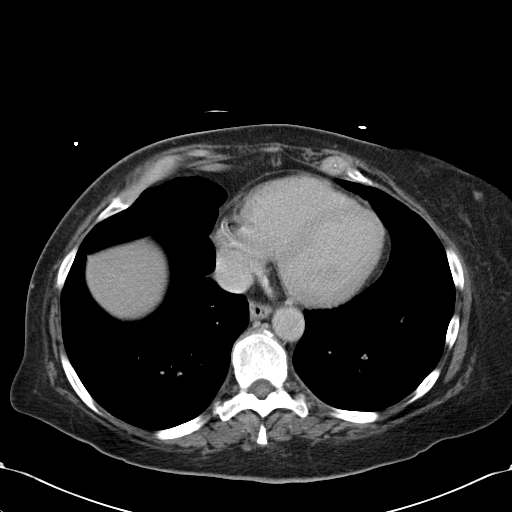

[Series 602: <mpr thick range> · coronal · 0.82mm/px · 3 of 77 slices shown]
[im 26/77  soft-tissue]
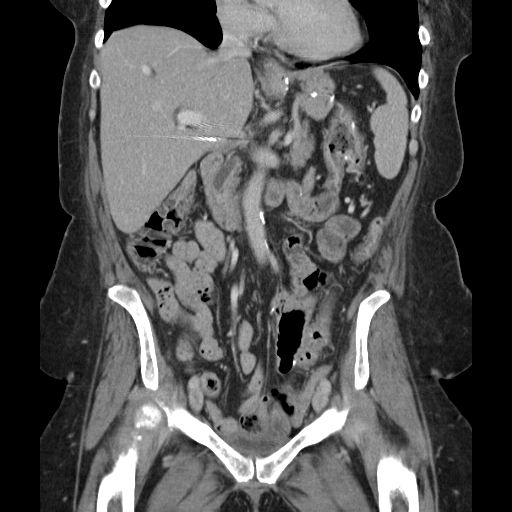
[im 34/77  soft-tissue]
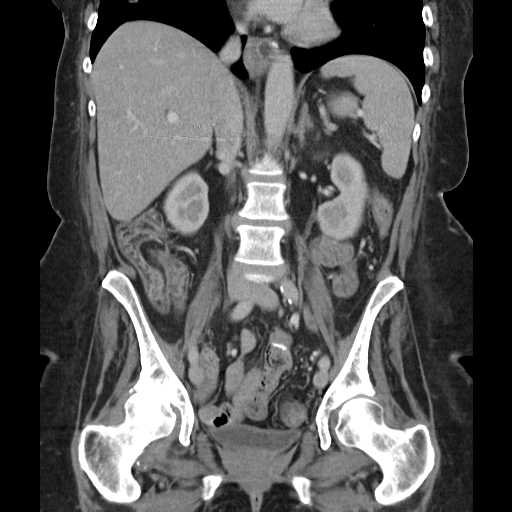
[im 43/77  soft-tissue]
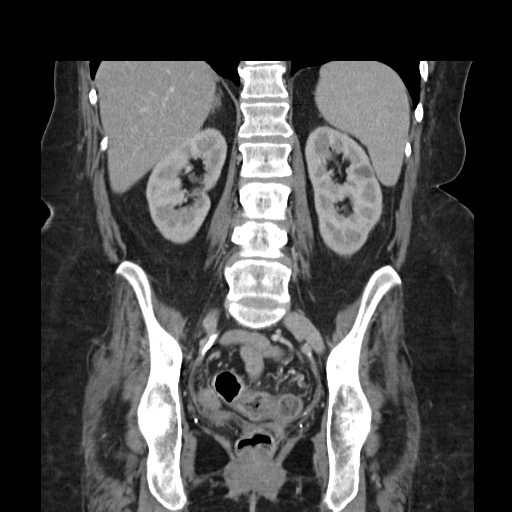

[17 of 46 positions shown; findings below may reference images not displayed]

FINDINGS: BODY WALL: Fat and vessels present within a small epigastric
abdominal wall hernia. Patient is status post extensive mesh repair
of a more inferior abdominal wall hernia.

LOWER CHEST: Coronary atherosclerosis.

ABDOMEN/PELVIS:

Liver: No focal abnormality.

Biliary: Mild intra and extrahepatic biliary dilatation post
cholecystectomy. Given the liver function tests, this is likely
reservoir effect.

Pancreas: Unremarkable.

Spleen: Unremarkable.

Adrenals: Unremarkable.

Kidneys and ureters: 8 mm stone in the interpolar left kidney which
is chronic. There is progressive cystic formation around the stone
which favors caliceal diverticulum (although there is no
confirmatory filling on delayed imaging).

Bladder: Unremarkable given decompressed state

Reproductive: Hysterectomy and probable oophorectomies.

Bowel: Status post gastric bypass and transverse descending colon
anastomosis. There is no bowel obstruction or definite inflammatory
wall thickening. Probable appendectomy.

Retroperitoneum: No mass or adenopathy.

Peritoneum: No ascites or pneumoperitoneum.

Vascular: No acute abnormality.

OSSEOUS: No acute abnormalities. Lower lumbar facet arthropathy with
mild L4-5 slip.
IMPRESSION: 1. No acute intra-abdominal findings.
2. Chronic/postsurgical changes are described above.

## 2019-06-24 ENCOUNTER — Encounter: Payer: Self-pay | Admitting: Gastroenterology
